# Patient Record
Sex: Male | Born: 1995 | Hispanic: Yes | Marital: Single | Smoking: Current every day smoker
Health system: Southern US, Community
[De-identification: ages and names within clinical notes are randomized; demographics above are authoritative.]

## PROBLEM LIST (undated history)

## (undated) VITALS — BP 140/90 | HR 89 | Temp 98.1°F | Resp 18

## (undated) DIAGNOSIS — F419 Anxiety disorder, unspecified: Secondary | ICD-10-CM

## (undated) DIAGNOSIS — J45909 Unspecified asthma, uncomplicated: Secondary | ICD-10-CM

## (undated) DIAGNOSIS — F909 Attention-deficit hyperactivity disorder, unspecified type: Secondary | ICD-10-CM

## (undated) DIAGNOSIS — F258 Other schizoaffective disorders: Secondary | ICD-10-CM

## (undated) DIAGNOSIS — F32A Depression, unspecified: Secondary | ICD-10-CM

## (undated) DIAGNOSIS — F329 Major depressive disorder, single episode, unspecified: Secondary | ICD-10-CM

## (undated) HISTORY — PX: APPENDECTOMY: SHX54

---
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
## 2016-05-27 IMAGING — DX DG CHEST 2V
2 series · 2 of 2 positions shown · non-contrast
Comparison: None.

CLINICAL DATA: Acute onset of cough and generalized weakness.
Initial encounter.

EXAM:
CHEST  2 VIEW

[w chest pa]
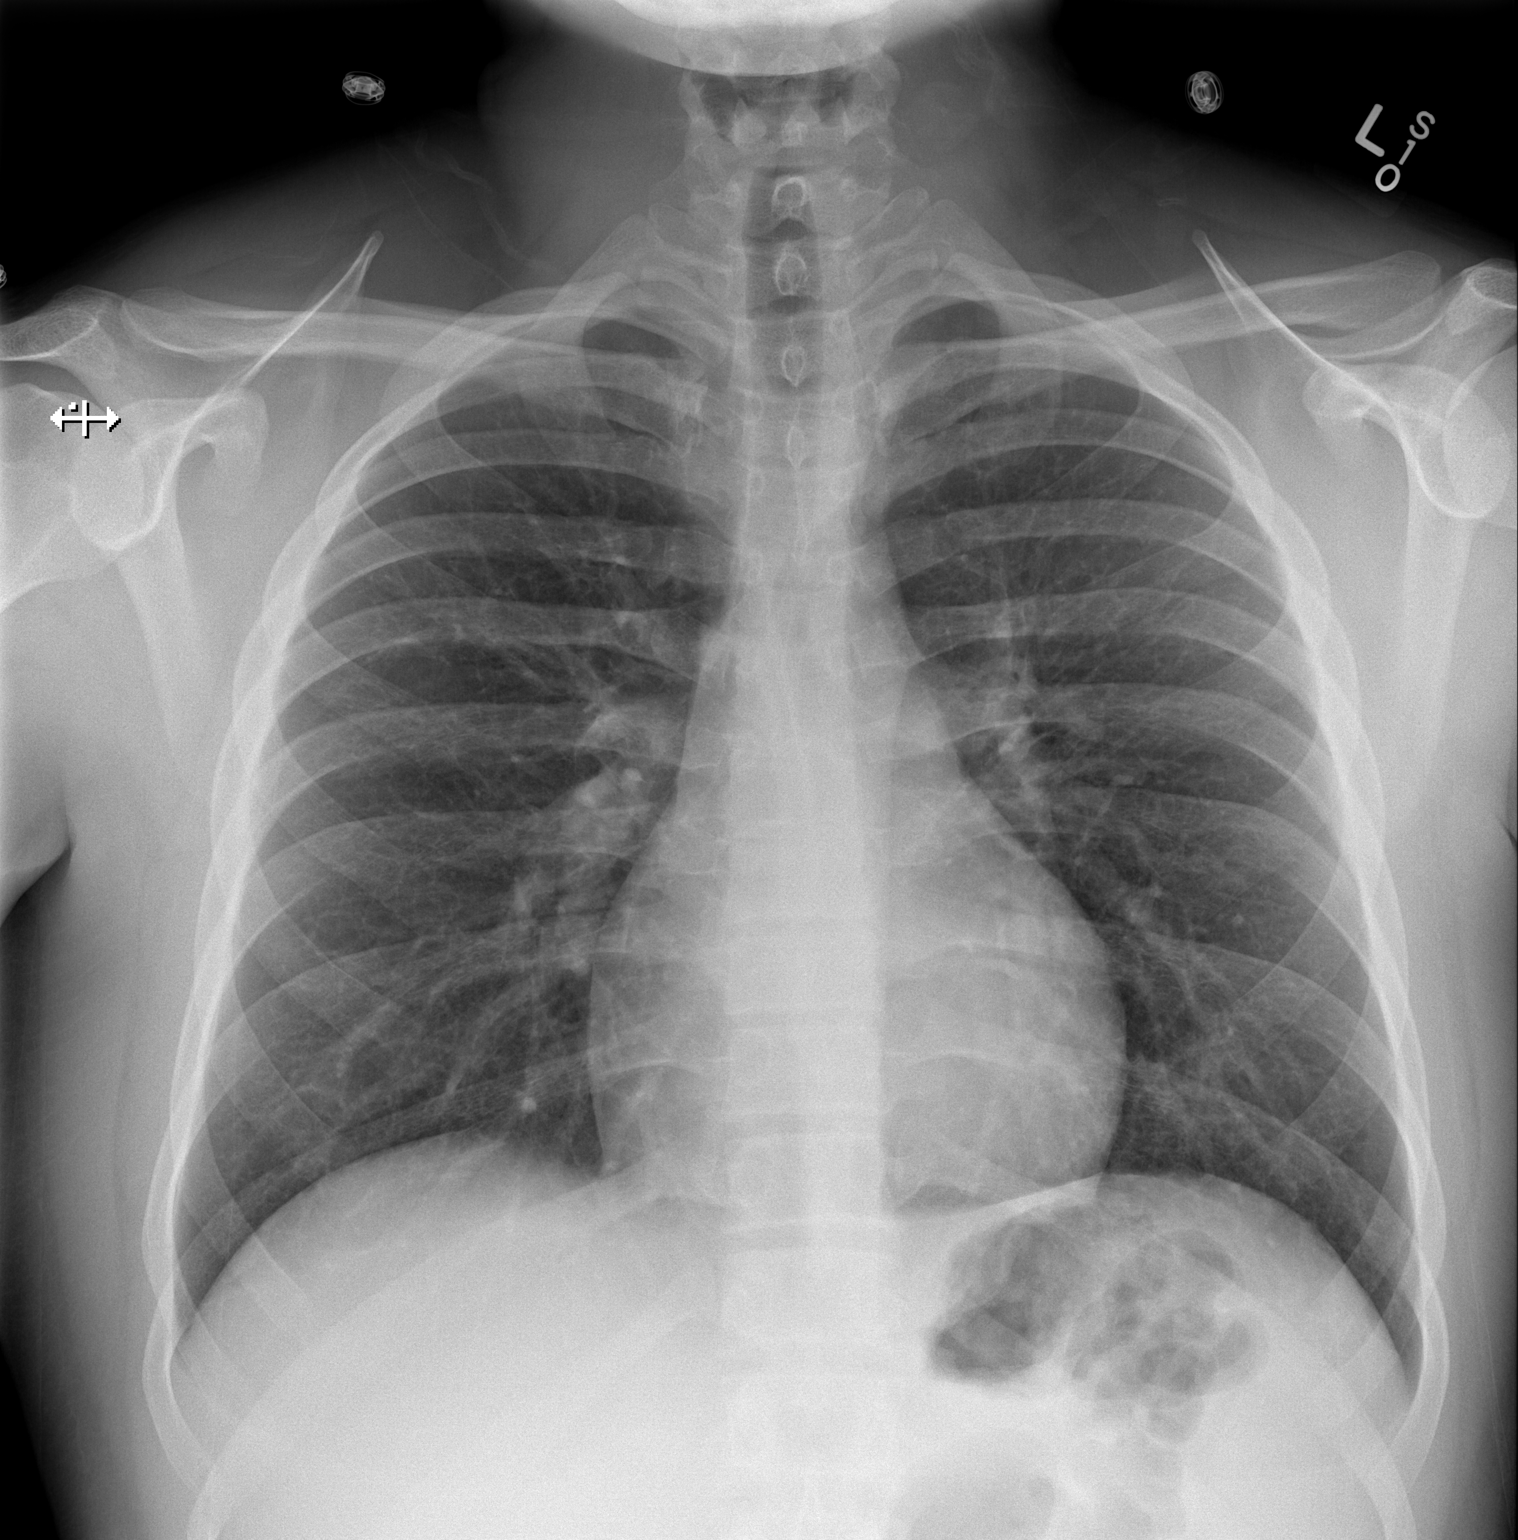

[w chest lat]
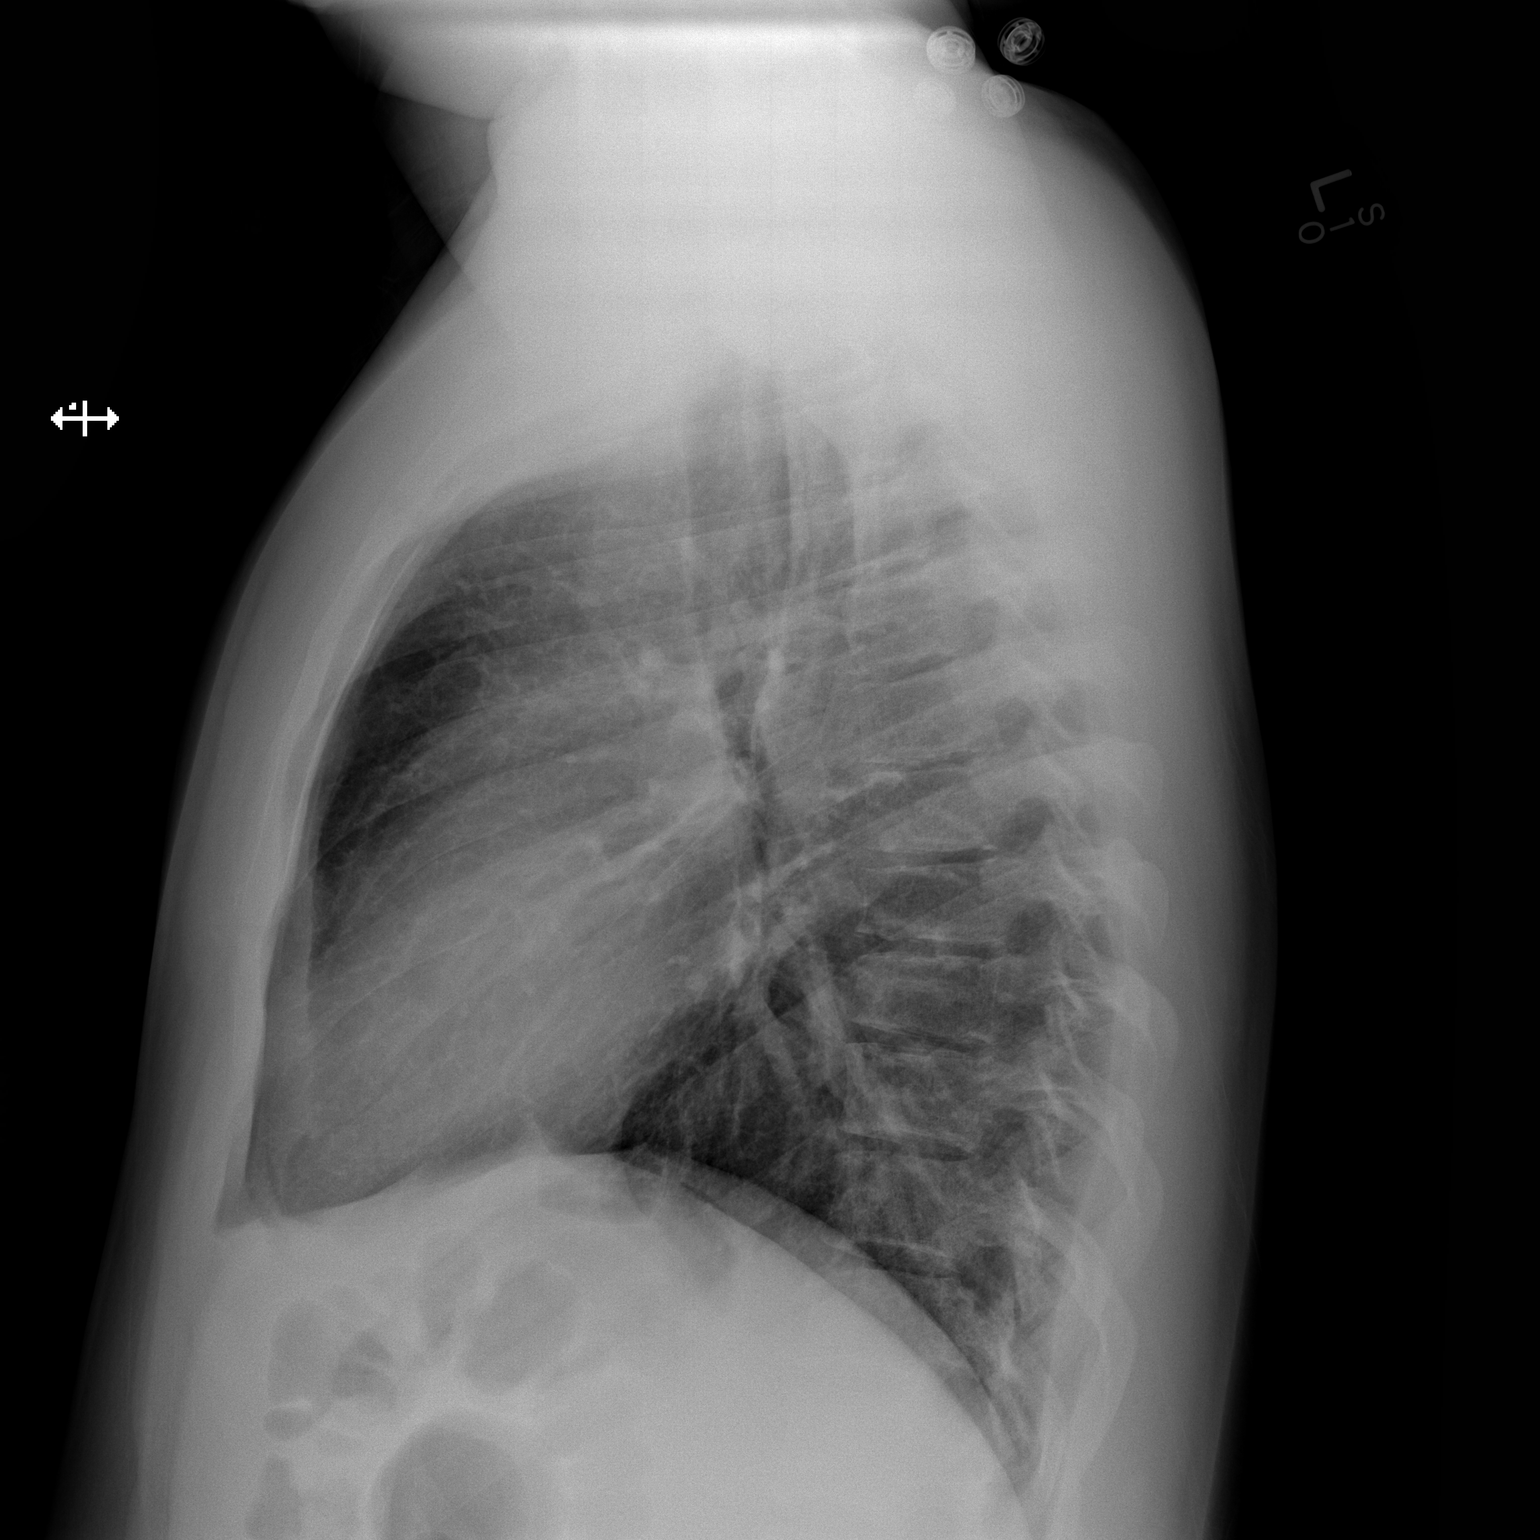

[2 of 2 positions shown; findings below may reference images not displayed]

FINDINGS: The lungs are well-aerated and clear. There is no evidence of focal
opacification, pleural effusion or pneumothorax.

The heart is normal in size; the mediastinal contour is within
normal limits. No acute osseous abnormalities are seen.
IMPRESSION: No acute cardiopulmonary process seen.

## 2016-07-24 ENCOUNTER — Encounter (HOSPITAL_COMMUNITY): Payer: Self-pay | Admitting: Emergency Medicine

## 2016-07-24 ENCOUNTER — Emergency Department (HOSPITAL_COMMUNITY)
Admission: EM | Admit: 2016-07-24 | Discharge: 2016-07-24 | Disposition: A | Discharge status: Home / Self Care | Payer: Medicaid Other | Attending: Emergency Medicine | Admitting: Emergency Medicine

## 2016-07-24 DIAGNOSIS — F17210 Nicotine dependence, cigarettes, uncomplicated: Secondary | ICD-10-CM | POA: Insufficient documentation

## 2016-07-24 DIAGNOSIS — Z5321 Procedure and treatment not carried out due to patient leaving prior to being seen by health care provider: Secondary | ICD-10-CM | POA: Insufficient documentation

## 2016-07-24 DIAGNOSIS — F909 Attention-deficit hyperactivity disorder, unspecified type: Secondary | ICD-10-CM | POA: Insufficient documentation

## 2016-07-24 DIAGNOSIS — J45909 Unspecified asthma, uncomplicated: Secondary | ICD-10-CM | POA: Diagnosis not present

## 2016-07-24 DIAGNOSIS — H5713 Ocular pain, bilateral: Secondary | ICD-10-CM | POA: Diagnosis not present

## 2016-07-24 HISTORY — DX: Unspecified asthma, uncomplicated: J45.909

## 2016-07-24 NOTE — ED Notes (Signed)
Call, no answer x2

## 2016-07-24 NOTE — ED Triage Notes (Signed)
Eye pain and tearing x 2 days, no in jury

## 2017-07-17 DIAGNOSIS — F259 Schizoaffective disorder, unspecified: Secondary | ICD-10-CM

## 2017-07-17 HISTORY — DX: Schizoaffective disorder, unspecified: F25.9

## 2018-10-26 ENCOUNTER — Encounter (HOSPITAL_COMMUNITY): Payer: Self-pay

## 2018-10-26 ENCOUNTER — Emergency Department (HOSPITAL_COMMUNITY): Payer: Medicaid Other

## 2018-10-26 ENCOUNTER — Other Ambulatory Visit: Payer: Self-pay

## 2018-10-26 ENCOUNTER — Inpatient Hospital Stay (HOSPITAL_COMMUNITY)
Admission: EM | Admit: 2018-10-26 | Discharge: 2018-10-29 | DRG: 199 | Disposition: A | Discharge status: Other Acute Inpatient Hospital | Payer: Medicaid Other | Attending: Internal Medicine | Admitting: Internal Medicine

## 2018-10-26 DIAGNOSIS — F419 Anxiety disorder, unspecified: Secondary | ICD-10-CM | POA: Diagnosis present

## 2018-10-26 DIAGNOSIS — J9383 Other pneumothorax: Secondary | ICD-10-CM | POA: Diagnosis present

## 2018-10-26 DIAGNOSIS — E876 Hypokalemia: Secondary | ICD-10-CM | POA: Diagnosis present

## 2018-10-26 DIAGNOSIS — F4310 Post-traumatic stress disorder, unspecified: Secondary | ICD-10-CM | POA: Diagnosis present

## 2018-10-26 DIAGNOSIS — R402142 Coma scale, eyes open, spontaneous, at arrival to emergency department: Secondary | ICD-10-CM | POA: Diagnosis present

## 2018-10-26 DIAGNOSIS — F331 Major depressive disorder, recurrent, moderate: Secondary | ICD-10-CM | POA: Diagnosis not present

## 2018-10-26 DIAGNOSIS — Z781 Physical restraint status: Secondary | ICD-10-CM

## 2018-10-26 DIAGNOSIS — F23 Brief psychotic disorder: Secondary | ICD-10-CM | POA: Diagnosis present

## 2018-10-26 DIAGNOSIS — Z915 Personal history of self-harm: Secondary | ICD-10-CM | POA: Diagnosis not present

## 2018-10-26 DIAGNOSIS — R040 Epistaxis: Secondary | ICD-10-CM | POA: Diagnosis present

## 2018-10-26 DIAGNOSIS — J982 Interstitial emphysema: Secondary | ICD-10-CM

## 2018-10-26 DIAGNOSIS — F17210 Nicotine dependence, cigarettes, uncomplicated: Secondary | ICD-10-CM | POA: Diagnosis present

## 2018-10-26 DIAGNOSIS — F909 Attention-deficit hyperactivity disorder, unspecified type: Secondary | ICD-10-CM | POA: Diagnosis present

## 2018-10-26 DIAGNOSIS — R402242 Coma scale, best verbal response, confused conversation, at arrival to emergency department: Secondary | ICD-10-CM | POA: Diagnosis present

## 2018-10-26 DIAGNOSIS — R402362 Coma scale, best motor response, obeys commands, at arrival to emergency department: Secondary | ICD-10-CM | POA: Diagnosis present

## 2018-10-26 DIAGNOSIS — Z008 Encounter for other general examination: Secondary | ICD-10-CM

## 2018-10-26 DIAGNOSIS — F201 Disorganized schizophrenia: Secondary | ICD-10-CM | POA: Diagnosis not present

## 2018-10-26 DIAGNOSIS — Z62810 Personal history of physical and sexual abuse in childhood: Secondary | ICD-10-CM | POA: Diagnosis present

## 2018-10-26 DIAGNOSIS — Z590 Homelessness: Secondary | ICD-10-CM

## 2018-10-26 DIAGNOSIS — G929 Unspecified toxic encephalopathy: Secondary | ICD-10-CM | POA: Diagnosis present

## 2018-10-26 DIAGNOSIS — G92 Toxic encephalopathy: Secondary | ICD-10-CM

## 2018-10-26 LAB — COMPREHENSIVE METABOLIC PANEL
ALT: 26 U/L (ref 0–44)
AST: 27 U/L (ref 15–41)
Albumin: 4.3 g/dL (ref 3.5–5.0)
Alkaline Phosphatase: 52 U/L (ref 38–126)
Anion gap: 14 (ref 5–15)
BUN: 12 mg/dL (ref 6–20)
CO2: 21 mmol/L — ABNORMAL LOW (ref 22–32)
Calcium: 9.1 mg/dL (ref 8.9–10.3)
Chloride: 104 mmol/L (ref 98–111)
Creatinine, Ser: 0.99 mg/dL (ref 0.61–1.24)
GFR calc Af Amer: >60 mL/min (ref >60)
GFR calc non Af Amer: >60 mL/min (ref >60)
Glucose, Bld: 101 mg/dL — ABNORMAL HIGH (ref 70–99)
Potassium: 3.1 mmol/L — ABNORMAL LOW (ref 3.5–5.1)
Sodium: 139 mmol/L (ref 135–145)
Total Bilirubin: 1.2 mg/dL (ref 0.3–1.2)
Total Protein: 7.2 g/dL (ref 6.5–8.1)

## 2018-10-26 LAB — CBC WITH DIFFERENTIAL/PLATELET
Abs Immature Granulocytes: 0.03 10*3/uL (ref 0.00–0.07)
Basophils Absolute: 0 10*3/uL (ref 0.0–0.1)
Basophils Relative: 0 %
Eosinophils Absolute: 0.2 10*3/uL (ref 0.0–0.5)
Eosinophils Relative: 3 %
HCT: 38.5 % — ABNORMAL LOW (ref 39.0–52.0)
Hemoglobin: 13.5 g/dL (ref 13.0–17.0)
Immature Granulocytes: 0 %
Lymphocytes Relative: 31 %
Lymphs Abs: 2.5 10*3/uL (ref 0.7–4.0)
MCH: 30.9 pg (ref 26.0–34.0)
MCHC: 35.1 g/dL (ref 30.0–36.0)
MCV: 88.1 fL (ref 80.0–100.0)
Monocytes Absolute: 0.6 10*3/uL (ref 0.1–1.0)
Monocytes Relative: 8 %
Neutro Abs: 4.8 10*3/uL (ref 1.7–7.7)
Neutrophils Relative %: 58 %
Platelets: 258 10*3/uL (ref 150–400)
RBC: 4.37 MIL/uL (ref 4.22–5.81)
RDW: 12.8 % (ref 11.5–15.5)
WBC: 8.1 10*3/uL (ref 4.0–10.5)
nRBC: 0 % (ref 0.0–0.2)

## 2018-10-26 LAB — ETHANOL: Alcohol, Ethyl (B): <10 mg/dL (ref <10)

## 2018-10-26 LAB — SALICYLATE LEVEL: Salicylate Lvl: <7 mg/dL (ref 2.8–30.0)

## 2018-10-26 LAB — ACETAMINOPHEN LEVEL: Acetaminophen (Tylenol), Serum: <10 ug/mL — ABNORMAL LOW (ref 10–30)

## 2018-10-26 MED ORDER — SODIUM CHLORIDE (PF) 0.9 % IJ SOLN
INTRAMUSCULAR | Status: AC
  Filled 2018-10-26: qty 50

## 2018-10-26 MED ORDER — IOHEXOL 300 MG/ML  SOLN
100.0000 mL | Freq: Once | INTRAMUSCULAR | Status: AC | PRN
  Administered 2018-10-26: 100 mL via INTRAVENOUS

## 2018-10-26 MED ORDER — LORAZEPAM 2 MG/ML IJ SOLN
2.0000 mg | Freq: Once | INTRAMUSCULAR | Status: DC
  Filled 2018-10-26: qty 1

## 2018-10-26 MED ORDER — POTASSIUM CHLORIDE 10 MEQ/100ML IV SOLN
10.0000 meq | INTRAVENOUS | Status: AC
  Administered 2018-10-26 – 2018-10-27 (×4): 10 meq via INTRAVENOUS
  Filled 2018-10-26 (×4): qty 100

## 2018-10-26 MED ORDER — LORAZEPAM 2 MG/ML IJ SOLN
2.0000 mg | Freq: Once | INTRAMUSCULAR | Status: AC
  Administered 2018-10-26: 2 mg via INTRAMUSCULAR

## 2018-10-26 MED ORDER — ZIPRASIDONE MESYLATE 20 MG IM SOLR
20.0000 mg | Freq: Once | INTRAMUSCULAR | Status: AC | PRN
  Administered 2018-10-26: 20 mg via INTRAMUSCULAR
  Filled 2018-10-26: qty 20

## 2018-10-26 MED ORDER — SODIUM CHLORIDE 0.9 % IV SOLN
INTRAVENOUS | Status: DC | PRN
  Administered 2018-10-26: 250 mL via INTRAVENOUS
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
## 2018-10-26 NOTE — ED Triage Notes (Addendum)
EMS reports, homeless, bystander called for bloody nose. Pt hx of bi-polar, schizophrenia. Pt non-compliant with meds. Pt uncooperative answering questions. Pt became erratic and attempted to elope, security was ordered by Dr. Freida Busman to return Pt to room.  BP 162/84 HR 72 RR 16

## 2018-10-26 NOTE — ED Notes (Signed)
ED TO INPATIENT HANDOFF REPORT  ED Nurse Name and Phone #: Delila Pereyra [phone]  S Name/Age/Gender Dalton Rosario 23 y.o. male Room/Bed: WA09/WA09  Code Status   Code Status: Full Code  Home/SNF/Other Home Patient oriented to: self, place, time and situation Is this baseline? Yes   Triage Complete: Triage complete  Chief Complaint med clearance  Triage Note EMS reports, homeless, bystander called for bloody nose. Pt hx of bi-polar, schizophrenia. Pt non-compliant with meds. Pt uncooperative answering questions. Pt became erratic and attempted to elope, security was ordered by Dr. Freida Busman to return Pt to room.  BP 162/84 HR 72 RR 16      Allergies No Known Allergies  Level of Care/Admitting Diagnosis ED Disposition    ED Disposition Condition Comment   Admit  Hospital Area: [facility] [[id number]]  Level of Care: Telemetry [5]  Admit to tele based on following criteria: Monitor QTC interval  Diagnosis: Pneumomediastinum [facility]) [[id number]]  Admitting Physician: Almon Hercules [[id number]]  Attending Physician: Almon Hercules [[id number]]  Estimated length of stay: past midnight tomorrow  Certification:: I certify this patient will need inpatient services for at least 2 midnights  PT Class (Do Not Modify): Inpatient [101]  PT Acc Code (Do Not Modify): Private [1]       B Medical/Surgery History Past Medical History:  Diagnosis Date  . ADHD (attention deficit hyperactivity disorder)   . Anxiety   . Asthma   . Depression    Past Surgical History:  Procedure Laterality Date  . APPENDECTOMY     Pt was 23 yo     A IV Location/Drains/Wounds Patient Lines/Drains/Airways Status   Active Line/Drains/Airways    Name:   Placement date:   Placement time:   Site:   Days:   Peripheral IV 10/26/18 Right Antecubital   10/26/18    1436    Antecubital   less than 1          Intake/Output Last 24 hours No intake or output data in the 24 hours ending  10/26/18 2111  Labs/Imaging Results for orders placed or performed during the hospital encounter of 10/26/18 (from the past 48 hour(s))  Ethanol     Status: None   Collection Time: 10/26/18  2:11 PM  Result Value Ref Range   Alcohol, Ethyl (B) <10 <10 mg/dL    Comment: (NOTE) Lowest detectable limit for serum alcohol is 10 mg/dL. For medical purposes only. Performed at [facility], [address], [place], [place] [address]   Salicylate level     Status: None   Collection Time: 10/26/18  2:11 PM  Result Value Ref Range   Salicylate Lvl <7.0 2.8 - 30.0 mg/dL    Comment: Performed at [facility], [address], [place], [place] [address]  Acetaminophen level     Status: Abnormal   Collection Time: 10/26/18  2:11 PM  Result Value Ref Range   Acetaminophen (Tylenol), Serum <10 (L) 10 - 30 ug/mL    Comment: (NOTE) Therapeutic concentrations vary significantly. A range of 10-30 ug/mL  may be an effective concentration for many patients. However, some  are best treated at concentrations outside of this range. Acetaminophen concentrations >150 ug/mL at 4 hours after ingestion  and >50 ug/mL at 12 hours after ingestion are often associated with  toxic reactions. Performed at [facility], [address] [place], [place] [address]   CBC with Differential/Platelet     Status:  Abnormal   Collection Time: 10/26/18  2:11 PM  Result Value Ref Range   WBC 8.1 4.0 - 10.5 K/uL   RBC 4.37 4.22 - 5.81 MIL/uL   Hemoglobin 13.5 13.0 - 17.0 g/dL   HCT 40.9 (L) 81.1 - 91.4 %   MCV 88.1 80.0 - 100.0 fL   MCH 30.9 26.0 - 34.0 pg   MCHC 35.1 30.0 - 36.0 g/dL   RDW 78.2 95.6 - 21.3 %   Platelets 258 150 - 400 K/uL   nRBC 0.0 0.0 - 0.2 %   Neutrophils Relative % 58 %   Neutro Abs 4.8 1.7 - 7.7 K/uL   Lymphocytes Relative 31 %   Lymphs Abs 2.5 0.7 - 4.0 K/uL   Monocytes Relative 8 %   Monocytes Absolute 0.6 0.1 - 1.0 K/uL    Eosinophils Relative 3 %   Eosinophils Absolute 0.2 0.0 - 0.5 K/uL   Basophils Relative 0 %   Basophils Absolute 0.0 0.0 - 0.1 K/uL   Immature Granulocytes 0 %   Abs Immature Granulocytes 0.03 0.00 - 0.07 K/uL    Comment: Performed at [facility], [address], [place], [place] [address]  Comprehensive metabolic panel     Status: Abnormal   Collection Time: 10/26/18  2:11 PM  Result Value Ref Range   Sodium 139 135 - 145 mmol/L   Potassium 3.1 (L) 3.5 - 5.1 mmol/L   Chloride 104 98 - 111 mmol/L   CO2 21 (L) 22 - 32 mmol/L   Glucose, Bld 101 (H) 70 - 99 mg/dL   BUN 12 6 - 20 mg/dL   Creatinine, Ser 8.46 0.61 - 1.24 mg/dL   Calcium 9.1 8.9 - 96.2 mg/dL   Total Protein 7.2 6.5 - 8.1 g/dL   Albumin 4.3 3.5 - 5.0 g/dL   AST 27 15 - 41 U/L   ALT 26 0 - 44 U/L   Alkaline Phosphatase 52 38 - 126 U/L   Total Bilirubin 1.2 0.3 - 1.2 mg/dL   GFR calc non Af Amer >60 >60 mL/min   GFR calc Af Amer >60 >60 mL/min   Anion gap 14 5 - 15    Comment: Performed at [facility], [address] [place], [place] [address]   Ct Head Wo Contrast  Result Date: 10/26/2018 CLINICAL DATA:  Altered level of consciousness. EXAM: CT HEAD WITHOUT CONTRAST CT CERVICAL SPINE WITHOUT CONTRAST TECHNIQUE: Multidetector CT imaging of the head and cervical spine was performed following the standard protocol without intravenous contrast. Multiplanar CT image reconstructions of the cervical spine were also generated. COMPARISON:  None. FINDINGS: CT HEAD FINDINGS Brain: No evidence of acute infarction, hemorrhage, hydrocephalus, extra-axial collection or mass lesion/mass effect. Vascular: No hyperdense vessel or unexpected calcification. Skull: Normal. Negative for fracture or focal lesion. Sinuses/Orbits: No acute finding. Other: None. CT CERVICAL SPINE FINDINGS Alignment: Normal. Skull base and vertebrae: No acute fracture. No primary bone lesion or focal pathologic process. Soft  tissues and spinal canal: No prevertebral fluid or swelling. No visible canal hematoma. Disc levels:  Normal. Upper chest: Negative. Other: Extensive amount of subcutaneous emphysema is noted in the cervical soft tissues which is concerning for extension from pneumomediastinum. IMPRESSION: Normal head CT. No evidence of cervical spine fracture or spondylolisthesis. Extensive amount of subcutaneous emphysema is noted in the cervical soft tissues which is concerning for extension from pneumomediastinum. Correlation with chest radiograph or CT scan is recommended. Electronically Signed   By: Fayrene Fearing  Christen ButterGreen Jr, M.D.   On: 10/26/2018 16:09   Ct Chest W Contrast  Result Date: 10/26/2018 CLINICAL DATA:  Blunt abdominal trauma. EXAM: CT CHEST, ABDOMEN, AND PELVIS WITH CONTRAST TECHNIQUE: Multidetector CT imaging of the chest, abdomen and pelvis was performed following the standard protocol during bolus administration of intravenous contrast. CONTRAST:  100mL OMNIPAQUE IOHEXOL 300 MG/ML  SOLN COMPARISON:  CT scan of May 18, 2015. FINDINGS: CT CHEST FINDINGS Cardiovascular: No significant vascular findings. Normal heart size. No pericardial effusion. Mediastinum/Nodes: Extensive pneumomediastinum is noted which extends into the supraclavicular regions bilaterally. Gas is also seen extending into the pre manubrial soft tissues. Thyroid gland is unremarkable. Lungs/Pleura: No pleural effusion is noted. Minimal pneumothorax is noted in the right major fissure which most likely represents extension of previously described pneumomediastinum. No definite consolidative process is noted in either lung. Musculoskeletal: No chest wall mass or suspicious bone lesions identified. CT ABDOMEN PELVIS FINDINGS Hepatobiliary: No focal liver abnormality is seen. No gallstones, gallbladder wall thickening, or biliary dilatation. Pancreas: Unremarkable. No pancreatic ductal dilatation or surrounding inflammatory changes. Spleen: Normal in  size without focal abnormality. Adrenals/Urinary Tract: Adrenal glands and kidneys appear normal. No hydronephrosis or renal obstruction is noted. Moderate diffuse urinary bladder wall thickening is noted which may represent lack of distension, but cystitis cannot be excluded. No renal or ureteral calculi are noted. Stomach/Bowel: The stomach appears normal. There is no evidence of bowel obstruction or inflammation. Status post appendectomy. Vascular/Lymphatic: No significant vascular findings are present. No enlarged abdominal or pelvic lymph nodes. Reproductive: Prostate is unremarkable. Other: No abdominal wall hernia or abnormality. No abdominopelvic ascites. Musculoskeletal: No acute or significant osseous findings. IMPRESSION: Extensive pneumomediastinum is noted which extends into the supraclavicular regions bilaterally. Gas is also seen extending into the premanubrial soft tissues. Minimal amount of pneumothorax is noted in the right major fissure which most likely represents extension of pneumomediastinum. These findings are concerning for possible tracheal, bronchial or esophageal injury or perforation. Clinical correlation is recommended. Moderate diffuse urinary bladder wall thickening is noted which may represent lack of distension, but cystitis cannot be excluded. Clinical correlation is recommended. No other abnormality seen in the abdomen or pelvis. Electronically Signed   By: Lupita RaiderJames  Green Jr, M.D.   On: 10/26/2018 18:52   Ct Cervical Spine Wo Contrast  Result Date: 10/26/2018 CLINICAL DATA:  Altered level of consciousness. EXAM: CT HEAD WITHOUT CONTRAST CT CERVICAL SPINE WITHOUT CONTRAST TECHNIQUE: Multidetector CT imaging of the head and cervical spine was performed following the standard protocol without intravenous contrast. Multiplanar CT image reconstructions of the cervical spine were also generated. COMPARISON:  None. FINDINGS: CT HEAD FINDINGS Brain: No evidence of acute infarction,  hemorrhage, hydrocephalus, extra-axial collection or mass lesion/mass effect. Vascular: No hyperdense vessel or unexpected calcification. Skull: Normal. Negative for fracture or focal lesion. Sinuses/Orbits: No acute finding. Other: None. CT CERVICAL SPINE FINDINGS Alignment: Normal. Skull base and vertebrae: No acute fracture. No primary bone lesion or focal pathologic process. Soft tissues and spinal canal: No prevertebral fluid or swelling. No visible canal hematoma. Disc levels:  Normal. Upper chest: Negative. Other: Extensive amount of subcutaneous emphysema is noted in the cervical soft tissues which is concerning for extension from pneumomediastinum. IMPRESSION: Normal head CT. No evidence of cervical spine fracture or spondylolisthesis. Extensive amount of subcutaneous emphysema is noted in the cervical soft tissues which is concerning for extension from pneumomediastinum. Correlation with chest radiograph or CT scan is recommended. Electronically Signed   By: Fayrene FearingJames  Christen Butter, M.D.   On: 10/26/2018 16:09   Ct Abdomen Pelvis W Contrast  Result Date: 10/26/2018 CLINICAL DATA:  Blunt abdominal trauma. EXAM: CT CHEST, ABDOMEN, AND PELVIS WITH CONTRAST TECHNIQUE: Multidetector CT imaging of the chest, abdomen and pelvis was performed following the standard protocol during bolus administration of intravenous contrast. CONTRAST:  OMNIPAQUE IOHEXOL 300 MG/ML  SOLN COMPARISON:  CT scan of May 18, 2015. FINDINGS: CT CHEST FINDINGS Cardiovascular: No significant vascular findings. Normal heart size. No pericardial effusion. Mediastinum/Nodes: Extensive pneumomediastinum is noted which extends into the supraclavicular regions bilaterally. Gas is also seen extending into the pre manubrial soft tissues. Thyroid gland is unremarkable. Lungs/Pleura: No pleural effusion is noted. Minimal pneumothorax is noted in the right major fissure which most likely represents extension of previously described  pneumomediastinum. No definite consolidative process is noted in either lung. Musculoskeletal: No chest wall mass or suspicious bone lesions identified. CT ABDOMEN PELVIS FINDINGS Hepatobiliary: No focal liver abnormality is seen. No gallstones, gallbladder wall thickening, or biliary dilatation. Pancreas: Unremarkable. No pancreatic ductal dilatation or surrounding inflammatory changes. Spleen: Normal in size without focal abnormality. Adrenals/Urinary Tract: Adrenal glands and kidneys appear normal. No hydronephrosis or renal obstruction is noted. Moderate diffuse urinary bladder wall thickening is noted which may represent lack of distension, but cystitis cannot be excluded. No renal or ureteral calculi are noted. Stomach/Bowel: The stomach appears normal. There is no evidence of bowel obstruction or inflammation. Status post appendectomy. Vascular/Lymphatic: No significant vascular findings are present. No enlarged abdominal or pelvic lymph nodes. Reproductive: Prostate is unremarkable. Other: No abdominal wall hernia or abnormality. No abdominopelvic ascites. Musculoskeletal: No acute or significant osseous findings. IMPRESSION: Extensive pneumomediastinum is noted which extends into the supraclavicular regions bilaterally. Gas is also seen extending into the premanubrial soft tissues. Minimal amount of pneumothorax is noted in the right major fissure which most likely represents extension of pneumomediastinum. These findings are concerning for possible tracheal, bronchial or esophageal injury or perforation. Clinical correlation is recommended. Moderate diffuse urinary bladder wall thickening is noted which may represent lack of distension, but cystitis cannot be excluded. Clinical correlation is recommended. No other abnormality seen in the abdomen or pelvis. Electronically Signed   By: Lupita Raider, M.D.   On: 10/26/2018 18:52   Dg Chest Port 1 View  Result Date: 10/26/2018 CLINICAL DATA:  Shortness  of breath. EXAM: PORTABLE CHEST 1 VIEW COMPARISON:  Radiographs of August 06, 2015. FINDINGS: Extensive pneumomediastinum is noted with subcutaneous emphysema is seen in both supraclavicular regions. No definite pneumothorax is noted. No pleural effusion is noted. No pulmonary opacity is noted. Bony thorax is unremarkable. IMPRESSION: Pneumomediastinum is noted with subcutaneous emphysema seen in both supraclavicular regions. This is concerning for possible esophageal or bronchial rupture or injury. Electronically Signed   By: Lupita Raider, M.D.   On: 10/26/2018 17:39    Pending Labs Unresulted Labs (From admission, onward)    Start     Ordered   10/27/18 0500  CBC  Tomorrow morning,   R     10/26/18 2004   10/27/18 0500  Basic metabolic panel  Tomorrow morning,   R     10/26/18 2004   10/27/18 0500  Protime-INR  Tomorrow morning,   R     10/26/18 2004   10/27/18 0500  APTT  Tomorrow morning,   R     10/26/18 2004   10/26/18 2001  HIV antibody (Routine Testing)  Once,   R  10/26/18 2004   10/26/18 1411  Rapid urine drug screen (hospital performed)  ONCE - STAT,   R     10/26/18 1410          Vitals/Pain Today's Vitals   10/26/18 1700 10/26/18 1851 10/26/18 1900 10/26/18 2000  BP: 113/62 127/66 (!) 135/57 (!) 109/55  Pulse: 77 70 69 (!) 54  Resp: 16 16 15 13   SpO2: 98% 96% 97% 100%    Isolation Precautions No active isolations  Medications Medications  sodium chloride (PF) 0.9 % injection (has no administration in time range)  potassium chloride 10 mEq in 100 mL IVPB (has no administration in time range)  ziprasidone (GEODON) injection 20 mg (20 mg Intramuscular Given 10/26/18 1418)  LORazepam (ATIVAN) injection 2 mg (2 mg Intramuscular Given 10/26/18 1438)  iohexol (OMNIPAQUE) 300 MG/ML solution 100 mL (100 mLs Intravenous Contrast Given 10/26/18 1756)    Mobility walks     Focused Assessments Cardiac Assessment Handoff:    No results found for: CKTOTAL, CKMB,  CKMBINDEX, TROPONINI No results found for: DDIMER Does the Patient currently have chest pain? No      R Recommendations: See Admitting Provider Note  Report given to: Brynda Greathouse RN  Additional Notes:

## 2018-10-26 NOTE — ED Notes (Signed)
Patient going upstairs. Sitter is with patient.

## 2018-10-26 NOTE — ED Provider Notes (Signed)
Blood pressure 113/62, pulse 77, resp. rate 16, SpO2 98 %.  Assuming care from Dr. Freida Busman.  In short, Dalton Rosario is a 23 y.o. male with a chief complaint of Medical Clearance .  Refer to the original H&P for additional details.  The current plan of care is to follow up on CT imaging and CXR.  07:22 PM  CT imaging and plain films reviewed.  Patient with diffuse pneumomediastinum with some extension into the right major fissure leading to some pneumothorax but thought to be an extension of the pneumomediastinum rather than the primary event.  Patient is somnolent after sedation.  He awakens to voice but then goes back to sleep.  He is in soft restraints.  Plan to discuss with cardiothoracic surgery regarding pneumomediastinum management and additional imaging and/or monitoring. No outward sign of neck trauma or strangulation. Patient unable to provide additional history at this time.   Discussed the case with Dr. Tyrone Sage. Plan to keep the patient NPO and get repeat CXR in the AM. Would like swallow study when patient able to cooperate after sedation but could get in the AM. Dr. Tyrone Sage will follow. Ok with staying at [employer].   According to initial EDP, the patient has a psychiatry history and appeared to be responding to internal stimuli. Will likely need TTS evaluation but patient not medically clear at this time.   Discussed patient's case with Hospitalist to request admission. Patient and family (if present) updated with plan. Care transferred to Hospitalist service.  I reviewed all nursing notes, vitals, pertinent old records, EKGs, labs, imaging (as available).    Maia Plan, MD 10/26/18 2100

## 2018-10-26 NOTE — ED Notes (Signed)
Waiting on a sitter to arrive for patient

## 2018-10-26 NOTE — H&P (Signed)
History and Physical    Dalton Rosario ZOX:[id number] DOB: [birth year] DOA: 10/26/2018  PCP: [facility] & Patient coming from: Unknown  Patient was somnolent after chemical restraint in ED and was not able to provide history.  Chief Complaint: "Medical clearance"  HPI: Dalton Rosario is a 23 y.o. male with history of depression, PTSD and ADHD brought to ED by EMS after found by a bystander sitting outside with bloody nose.    In ED, vital signs not impressive.  CBC within normal range.  CMP with hypokalemia to 3.1 and bicarb to 21 otherwise normal.  EKG normal sinus rhythm.  Tylenol, salicylate and alcohol levels within normal range.  CT head and cervical spine without acute intracranial finding or osseous issues but extensive amount of subcutaneous emphysema in the cervical soft tissue concerning for pneumomediastinum.  This was also noted on portable chest x-ray and CT chest.  Concern about possible tracheal, bronchial or esophageal injury or perforation.  Per EDP, Dr. Tyrone Sage from [facility] was consulted and recommended keeping patient n.p.o., repeat chest x-ray in the morning and a swallow study when patient is able to cooperate. Per EDP, Dr. Tyrone Sage will see patient in the morning.  Per EDP note, patient was involuntarily committed and restrained chemically after he threatened to leave ED.    ROS Not able to obtain due to patient's mental status. PMH Past Medical History:  Diagnosis Date   ADHD (attention deficit hyperactivity disorder)    Anxiety    Asthma    Depression    PSH Past Surgical History:  Procedure Laterality Date   APPENDECTOMY     Pt was 23 yo   Fam HX Not able to obtain due to patient's mental status.  Social Hx Not able to obtain due to patient's mental status. Allergy Not able to obtain due to patient mental status. Prior to Admission medications   Medication Sig Start Date End Date Taking? Authorizing  Provider  acetaminophen (TYLENOL 8 HOUR) 650 MG CR tablet Take 1 tablet (650 mg total) by mouth every 8 (eight) hours as needed for pain. Patient not taking: Reported on 12/12/2015 05/18/15   Emilia Beck, PA-C  acetaminophen (TYLENOL) 325 MG tablet Take 325 mg by mouth every 6 (six) hours as needed for moderate pain.    [provider]  ARIPiprazole (ABILIFY) 15 MG tablet Take 1 tablet (15 mg total) by mouth every morning. Patient not taking: Reported on 12/05/2014 06/26/12   Trinda Pascal B, NP  belladonna-opium (B&O SUPPRETTES) 16.2-30 MG suppository Place 1 suppository rectally every 8 (eight) hours as needed for pain. Patient not taking: Reported on 05/18/2015 05/16/15   Jonette Eva, MD  benzonatate (TESSALON PERLES) 100 MG capsule Take 1 capsule (100 mg total) by mouth 3 (three) times daily as needed for cough. Patient not taking: Reported on 12/12/2015 05/16/15   Jonette Eva, MD  benzonatate (TESSALON) 100 MG capsule Take 1 capsule (100 mg total) by mouth every 8 (eight) hours. Patient not taking: Reported on 12/12/2015 08/06/15   Dowless, Lelon Mast Tripp, PA-C  clindamycin (CLEOCIN) 150 MG capsule Take 2 capsules (300 mg total) by mouth 3 (three) times daily. May dispense as  capsules Patient not taking: Reported on 07/06/2015 05/18/15   Emilia Beck, PA-C  docusate sodium (COLACE) 100 MG capsule Take 1 capsule (100 mg total) by mouth daily as needed for mild constipation. Patient not taking: Reported on 05/18/2015 05/16/15   Jonette Eva, MD  escitalopram (LEXAPRO) 10 MG tablet  Take 1 tablet (10 mg total) by mouth every morning. Patient not taking: Reported on 12/05/2014 06/26/12   Trinda Pascal B, NP  ibuprofen (ADVIL,MOTRIN) 200 MG tablet Take 400 mg by mouth every 6 (six) hours as needed for headache, mild pain or moderate pain.    [provider]  methylphenidate 36 MG PO CR tablet Take 1 tablet (36 mg total) by mouth daily. Patient not taking: Reported on  11/17/2014 11/13/14   Radene Gunning, MD  Misc. Devices (SITZ BATH) MISC Twice daily for the next week. Patient not taking: Reported on 05/18/2015 05/16/15   Jonette Eva, MD  nitrofurantoin, macrocrystal-monohydrate, (MACROBID) 100 MG capsule Take 1 capsule (100 mg total) by mouth every 12 (twelve) hours. Patient needs 13 more doses to complete a 10day course of antibiotics for UTI. Patient not taking: Reported on 11/17/2014 06/26/12   Trinda Pascal B, NP  ondansetron (ZOFRAN-ODT) 8 MG disintegrating tablet Take 1 tablet (8 mg total) by mouth every 8 (eight) hours as needed for nausea or vomiting. Patient not taking: Reported on 05/18/2015 11/13/14   Radene Gunning, MD  ranitidine (ZANTAC) 150 MG tablet Take 1 tablet (150 mg total) by mouth 2 (two) times daily. 07/06/15   Melene Plan, DO  simethicone (MYLICON) 125 MG chewable tablet Chew 125 mg by mouth every 6 (six) hours as needed for flatulence.    [provider]    Physical Exam: Vitals:   10/26/18 1547 10/26/18 1600 10/26/18 1700 10/26/18 1851  BP: 136/67 127/60 113/62 127/66  Pulse: 88 82 77 70  Resp: 14  16 16   SpO2: 97% 96% 98% 96%    Constitutional -somnolent.  Resting comfortably.  No acute distress. Eyes - vision grossly intact. Sclera anicteric.  Pupils about 3 to 4 mm wide and reactive to light. Nose - no gross deformity but some dried blood on his upper lips Mouth - no oral lesions noted Throat - no swelling or erythema Endo - no obvious thyromegaly CV -RRR. (+)S1S2, no murmurs; no JVD or peripheral edema Resp - No increased work of breathing, good aeration bilaterally, no wheeze or crackles GI - (+)BS, soft, non-tender, non-distended MSK - normal range of motion, no obvious deformity Skin - no obvious rashes or lesions.  Crepitus along the clavicular lines on both side Neuro -somnolent.  Moans in response to noxious stimuli.  Moves extremities spontaneously. Psych -somnolent.  Labs on Admission: I have personally  reviewed following labs and imaging studies  CBC: Recent Labs  Lab 10/26/18 1411  WBC 8.1  NEUTROABS 4.8  HGB 13.5  HCT 38.5*  MCV 88.1  PLT 258   Basic Metabolic Panel: Recent Labs  Lab 10/26/18 1411  NA 139  K 3.1*  CL 104  CO2 21*  GLUCOSE 101*  BUN 12  CREATININE 0.99  CALCIUM 9.1   GFR: CrCl cannot be calculated (Unknown ideal weight.). Liver Function Tests: Recent Labs  Lab 10/26/18 1411  AST 27  ALT 26  ALKPHOS 52  BILITOT 1.2  PROT 7.2  ALBUMIN 4.3   No results for input(s): LIPASE, AMYLASE in the last 168 hours. No results for input(s): AMMONIA in the last 168 hours. Coagulation Profile: No results for input(s): INR, PROTIME in the last 168 hours. Cardiac Enzymes: No results for input(s): CKTOTAL, CKMB, CKMBINDEX, TROPONINI in the last 168 hours. BNP (last 3 results) No results for input(s): PROBNP in the last 8760 hours. HbA1C: No results for input(s): HGBA1C in the last 72 hours.  CBG: No results for input(s): GLUCAP in the last 168 hours. Lipid Profile: No results for input(s): CHOL, HDL, LDLCALC, TRIG, CHOLHDL, LDLDIRECT in the last 72 hours. Thyroid Function Tests: No results for input(s): TSH, T4TOTAL, FREET4, T3FREE, THYROIDAB in the last 72 hours. Anemia Panel: No results for input(s): VITAMINB12, FOLATE, FERRITIN, TIBC, IRON, RETICCTPCT in the last 72 hours. Urine analysis:    Component Value Date/Time   COLORURINE YELLOW 12/12/2015 1013   APPEARANCEUR CLOUDY (A) 12/12/2015 1013   LABSPEC 1.025 12/12/2015 1013   PHURINE 6.0 12/12/2015 1013   GLUCOSEU NEGATIVE 12/12/2015 1013   HGBUR NEGATIVE 12/12/2015 1013   BILIRUBINUR NEGATIVE 12/12/2015 1013   KETONESUR NEGATIVE 12/12/2015 1013   PROTEINUR NEGATIVE 12/12/2015 1013   UROBILINOGEN 0.2 06/24/2012 2013   NITRITE NEGATIVE 12/12/2015 1013   LEUKOCYTESUR SMALL (A) 12/12/2015 1013    Sepsis Labs:  WBC within normal range.  Radiological Exams on Admission: Ct Head Wo  Contrast  Result Date: 10/26/2018 CLINICAL DATA:  Altered level of consciousness. EXAM: CT HEAD WITHOUT CONTRAST CT CERVICAL SPINE WITHOUT CONTRAST TECHNIQUE: Multidetector CT imaging of the head and cervical spine was performed following the standard protocol without intravenous contrast. Multiplanar CT image reconstructions of the cervical spine were also generated. COMPARISON:  None. FINDINGS: CT HEAD FINDINGS Brain: No evidence of acute infarction, hemorrhage, hydrocephalus, extra-axial collection or mass lesion/mass effect. Vascular: No hyperdense vessel or unexpected calcification. Skull: Normal. Negative for fracture or focal lesion. Sinuses/Orbits: No acute finding. Other: None. CT CERVICAL SPINE FINDINGS Alignment: Normal. Skull base and vertebrae: No acute fracture. No primary bone lesion or focal pathologic process. Soft tissues and spinal canal: No prevertebral fluid or swelling. No visible canal hematoma. Disc levels:  Normal. Upper chest: Negative. Other: Extensive amount of subcutaneous emphysema is noted in the cervical soft tissues which is concerning for extension from pneumomediastinum. IMPRESSION: Normal head CT. No evidence of cervical spine fracture or spondylolisthesis. Extensive amount of subcutaneous emphysema is noted in the cervical soft tissues which is concerning for extension from pneumomediastinum. Correlation with chest radiograph or CT scan is recommended. Electronically Signed   By: Lupita Raider, M.D.   On: 10/26/2018 16:09   Ct Chest W Contrast  Result Date: 10/26/2018 CLINICAL DATA:  Blunt abdominal trauma. EXAM: CT CHEST, ABDOMEN, AND PELVIS WITH CONTRAST TECHNIQUE: Multidetector CT imaging of the chest, abdomen and pelvis was performed following the standard protocol during bolus administration of intravenous contrast. CONTRAST:  OMNIPAQUE IOHEXOL 300 MG/ML  SOLN COMPARISON:  CT scan of May 18, 2015. FINDINGS: CT CHEST FINDINGS Cardiovascular: No significant  vascular findings. Normal heart size. No pericardial effusion. Mediastinum/Nodes: Extensive pneumomediastinum is noted which extends into the supraclavicular regions bilaterally. Gas is also seen extending into the pre manubrial soft tissues. Thyroid gland is unremarkable. Lungs/Pleura: No pleural effusion is noted. Minimal pneumothorax is noted in the right major fissure which most likely represents extension of previously described pneumomediastinum. No definite consolidative process is noted in either lung. Musculoskeletal: No chest wall mass or suspicious bone lesions identified. CT ABDOMEN PELVIS FINDINGS Hepatobiliary: No focal liver abnormality is seen. No gallstones, gallbladder wall thickening, or biliary dilatation. Pancreas: Unremarkable. No pancreatic ductal dilatation or surrounding inflammatory changes. Spleen: Normal in size without focal abnormality. Adrenals/Urinary Tract: Adrenal glands and kidneys appear normal. No hydronephrosis or renal obstruction is noted. Moderate diffuse urinary bladder wall thickening is noted which may represent lack of distension, but cystitis cannot be excluded. No renal or ureteral calculi are noted.  Stomach/Bowel: The stomach appears normal. There is no evidence of bowel obstruction or inflammation. Status post appendectomy. Vascular/Lymphatic: No significant vascular findings are present. No enlarged abdominal or pelvic lymph nodes. Reproductive: Prostate is unremarkable. Other: No abdominal wall hernia or abnormality. No abdominopelvic ascites. Musculoskeletal: No acute or significant osseous findings. IMPRESSION: Extensive pneumomediastinum is noted which extends into the supraclavicular regions bilaterally. Gas is also seen extending into the premanubrial soft tissues. Minimal amount of pneumothorax is noted in the right major fissure which most likely represents extension of pneumomediastinum. These findings are concerning for possible tracheal, bronchial or  esophageal injury or perforation. Clinical correlation is recommended. Moderate diffuse urinary bladder wall thickening is noted which may represent lack of distension, but cystitis cannot be excluded. Clinical correlation is recommended. No other abnormality seen in the abdomen or pelvis. Electronically Signed   By: Lupita Raider, M.D.   On: 10/26/2018 18:52   Ct Cervical Spine Wo Contrast  Result Date: 10/26/2018 CLINICAL DATA:  Altered level of consciousness. EXAM: CT HEAD WITHOUT CONTRAST CT CERVICAL SPINE WITHOUT CONTRAST TECHNIQUE: Multidetector CT imaging of the head and cervical spine was performed following the standard protocol without intravenous contrast. Multiplanar CT image reconstructions of the cervical spine were also generated. COMPARISON:  None. FINDINGS: CT HEAD FINDINGS Brain: No evidence of acute infarction, hemorrhage, hydrocephalus, extra-axial collection or mass lesion/mass effect. Vascular: No hyperdense vessel or unexpected calcification. Skull: Normal. Negative for fracture or focal lesion. Sinuses/Orbits: No acute finding. Other: None. CT CERVICAL SPINE FINDINGS Alignment: Normal. Skull base and vertebrae: No acute fracture. No primary bone lesion or focal pathologic process. Soft tissues and spinal canal: No prevertebral fluid or swelling. No visible canal hematoma. Disc levels:  Normal. Upper chest: Negative. Other: Extensive amount of subcutaneous emphysema is noted in the cervical soft tissues which is concerning for extension from pneumomediastinum. IMPRESSION: Normal head CT. No evidence of cervical spine fracture or spondylolisthesis. Extensive amount of subcutaneous emphysema is noted in the cervical soft tissues which is concerning for extension from pneumomediastinum. Correlation with chest radiograph or CT scan is recommended. Electronically Signed   By: Lupita Raider, M.D.   On: 10/26/2018 16:09   Ct Abdomen Pelvis W Contrast  Result Date: 10/26/2018 CLINICAL  DATA:  Blunt abdominal trauma. EXAM: CT CHEST, ABDOMEN, AND PELVIS WITH CONTRAST TECHNIQUE: Multidetector CT imaging of the chest, abdomen and pelvis was performed following the standard protocol during bolus administration of intravenous contrast. CONTRAST:  OMNIPAQUE IOHEXOL 300 MG/ML  SOLN COMPARISON:  CT scan of May 18, 2015. FINDINGS: CT CHEST FINDINGS Cardiovascular: No significant vascular findings. Normal heart size. No pericardial effusion. Mediastinum/Nodes: Extensive pneumomediastinum is noted which extends into the supraclavicular regions bilaterally. Gas is also seen extending into the pre manubrial soft tissues. Thyroid gland is unremarkable. Lungs/Pleura: No pleural effusion is noted. Minimal pneumothorax is noted in the right major fissure which most likely represents extension of previously described pneumomediastinum. No definite consolidative process is noted in either lung. Musculoskeletal: No chest wall mass or suspicious bone lesions identified. CT ABDOMEN PELVIS FINDINGS Hepatobiliary: No focal liver abnormality is seen. No gallstones, gallbladder wall thickening, or biliary dilatation. Pancreas: Unremarkable. No pancreatic ductal dilatation or surrounding inflammatory changes. Spleen: Normal in size without focal abnormality. Adrenals/Urinary Tract: Adrenal glands and kidneys appear normal. No hydronephrosis or renal obstruction is noted. Moderate diffuse urinary bladder wall thickening is noted which may represent lack of distension, but cystitis cannot be excluded. No renal or ureteral calculi are  noted. Stomach/Bowel: The stomach appears normal. There is no evidence of bowel obstruction or inflammation. Status post appendectomy. Vascular/Lymphatic: No significant vascular findings are present. No enlarged abdominal or pelvic lymph nodes. Reproductive: Prostate is unremarkable. Other: No abdominal wall hernia or abnormality. No abdominopelvic ascites. Musculoskeletal: No acute or  significant osseous findings. IMPRESSION: Extensive pneumomediastinum is noted which extends into the supraclavicular regions bilaterally. Gas is also seen extending into the premanubrial soft tissues. Minimal amount of pneumothorax is noted in the right major fissure which most likely represents extension of pneumomediastinum. These findings are concerning for possible tracheal, bronchial or esophageal injury or perforation. Clinical correlation is recommended. Moderate diffuse urinary bladder wall thickening is noted which may represent lack of distension, but cystitis cannot be excluded. Clinical correlation is recommended. No other abnormality seen in the abdomen or pelvis. Electronically Signed   By: Lupita RaiderJames  Green Jr, M.D.   On: 10/26/2018 18:52   Dg Chest Port 1 View  Result Date: 10/26/2018 CLINICAL DATA:  Shortness of breath. EXAM: PORTABLE CHEST 1 VIEW COMPARISON:  Radiographs of August 06, 2015. FINDINGS: Extensive pneumomediastinum is noted with subcutaneous emphysema is seen in both supraclavicular regions. No definite pneumothorax is noted. No pleural effusion is noted. No pulmonary opacity is noted. Bony thorax is unremarkable. IMPRESSION: Pneumomediastinum is noted with subcutaneous emphysema seen in both supraclavicular regions. This is concerning for possible esophageal or bronchial rupture or injury. Electronically Signed   By: Lupita RaiderJames  Green Jr, M.D.   On: 10/26/2018 17:39    All images have been reviewed by me personally.   EKG: Independently reviewed and showed normal sinus rhythm without acute ischemic finding.   Assessment/Plan Principal Problem:   Pneumomediastinum (HCC) Active Problems:   MDD (major depressive disorder), recurrent episode, moderate (HCC)   Toxic encephalopathy   Hypokalemia  Pneumomediastinum: unclear etiology of this at this point. Concerning for possible tracheal, bronchial or esophageal injury or perforation.  Hemodynamically stable. -CTS to see patient  in the morning. -Repeat chest x-ray in the morning. -Swallow eval if patient cooperates.  Currently somnolent. -N.p.o.  Acute encephalopathy/agitation: Likely toxic.  No finding to suggest infectious process.  Limited neuro exam but no focal neuro deficit. -Involuntary committed and chemically restrained in ED. -May need Haldol as needed. -Follow UDS  Hypokalemia -Replenish and recheck.  Psych issues/MDD/PTSD/ADHD -Consult psych when medically stable.  DVT prophylaxis: SCD Code Status: Full code Family Communication: None at bedside  Disposition Plan: Admit to telemetry bed Consults called: Cardiothoracic surgery Admission status: Inpatient status   Almon Herculesaye T Edwina Grossberg MD [facility]  If 7PM-7AM, please contact night-coverage [email or web address] Password [email or web address]  10/26/2018, 8:19 PM

## 2018-10-26 NOTE — Progress Notes (Signed)
[address]       [place] [id number]             [phone]      Asked by Dr Kathrene BongoLong-ED to review case and ct's AS noted in history patient is not able to give any history. Not noted to have any evidnece of external neck trauma penetrating or blunt force Ct Head Wo Contrast  Result Date: 10/26/2018 CLINICAL DATA:  Altered level of consciousness. EXAM: CT HEAD WITHOUT CONTRAST CT CERVICAL SPINE WITHOUT CONTRAST TECHNIQUE: Multidetector CT imaging of the head and cervical spine was performed following the standard protocol without intravenous contrast. Multiplanar CT image reconstructions of the cervical spine were also generated. COMPARISON:  None. FINDINGS: CT HEAD FINDINGS Brain: No evidence of acute infarction, hemorrhage, hydrocephalus, extra-axial collection or mass lesion/mass effect. Vascular: No hyperdense vessel or unexpected calcification. Skull: Normal. Negative for fracture or focal lesion. Sinuses/Orbits: No acute finding. Other: None. CT CERVICAL SPINE FINDINGS Alignment: Normal. Skull base and vertebrae: No acute fracture. No primary bone lesion or focal pathologic process. Soft tissues and spinal canal: No prevertebral fluid or swelling. No visible canal hematoma. Disc levels:  Normal. Upper chest: Negative. Other: Extensive amount of subcutaneous emphysema is noted in the cervical soft tissues which is concerning for extension from pneumomediastinum. IMPRESSION: Normal head CT. No evidence of cervical spine fracture or spondylolisthesis. Extensive amount of subcutaneous emphysema is noted in the cervical soft tissues which is concerning for extension from pneumomediastinum. Correlation with chest radiograph or CT scan is recommended. Electronically Signed   By: Lupita RaiderJames  Green Jr, M.D.   On: 10/26/2018 16:09   Ct Chest W Contrast  Result Date: 10/26/2018 CLINICAL DATA:  Blunt abdominal trauma. EXAM: CT CHEST, ABDOMEN, AND PELVIS WITH CONTRAST TECHNIQUE:  Multidetector CT imaging of the chest, abdomen and pelvis was performed following the standard protocol during bolus administration of intravenous contrast. CONTRAST:  100mL OMNIPAQUE IOHEXOL 300 MG/ML  SOLN COMPARISON:  CT scan of May 18, 2015. FINDINGS: CT CHEST FINDINGS Cardiovascular: No significant vascular findings. Normal heart size. No pericardial effusion. Mediastinum/Nodes: Extensive pneumomediastinum is noted which extends into the supraclavicular regions bilaterally. Gas is also seen extending into the pre manubrial soft tissues. Thyroid gland is unremarkable. Lungs/Pleura: No pleural effusion is noted. Minimal pneumothorax is noted in the right major fissure which most likely represents extension of previously described pneumomediastinum. No definite consolidative process is noted in either lung. Musculoskeletal: No chest wall mass or suspicious bone lesions identified. CT ABDOMEN PELVIS FINDINGS Hepatobiliary: No focal liver abnormality is seen. No gallstones, gallbladder wall thickening, or biliary dilatation. Pancreas: Unremarkable. No pancreatic ductal dilatation or surrounding inflammatory changes. Spleen: Normal in size without focal abnormality. Adrenals/Urinary Tract: Adrenal glands and kidneys appear normal. No hydronephrosis or renal obstruction is noted. Moderate diffuse urinary bladder wall thickening is noted which may represent lack of distension, but cystitis cannot be excluded. No renal or ureteral calculi are noted. Stomach/Bowel: The stomach appears normal. There is no evidence of bowel obstruction or inflammation. Status post appendectomy. Vascular/Lymphatic: No significant vascular findings are present. No enlarged abdominal or pelvic lymph nodes. Reproductive: Prostate is unremarkable. Other: No abdominal wall hernia or abnormality. No abdominopelvic ascites. Musculoskeletal: No acute or significant osseous findings. IMPRESSION: Extensive pneumomediastinum is noted which extends  into the supraclavicular regions bilaterally. Gas is also seen extending into the premanubrial soft tissues. Minimal amount of pneumothorax is noted in the right major fissure which most likely represents  extension of pneumomediastinum. These findings are concerning for possible tracheal, bronchial or esophageal injury or perforation. Clinical correlation is recommended. Moderate diffuse urinary bladder wall thickening is noted which may represent lack of distension, but cystitis cannot be excluded. Clinical correlation is recommended. No other abnormality seen in the abdomen or pelvis. Electronically Signed   By: Lupita Raider, M.D.   On: 10/26/2018 18:52   Ct Cervical Spine Wo Contrast  Result Date: 10/26/2018 CLINICAL DATA:  Altered level of consciousness. EXAM: CT HEAD WITHOUT CONTRAST CT CERVICAL SPINE WITHOUT CONTRAST TECHNIQUE: Multidetector CT imaging of the head and cervical spine was performed following the standard protocol without intravenous contrast. Multiplanar CT image reconstructions of the cervical spine were also generated. COMPARISON:  None. FINDINGS: CT HEAD FINDINGS Brain: No evidence of acute infarction, hemorrhage, hydrocephalus, extra-axial collection or mass lesion/mass effect. Vascular: No hyperdense vessel or unexpected calcification. Skull: Normal. Negative for fracture or focal lesion. Sinuses/Orbits: No acute finding. Other: None. CT CERVICAL SPINE FINDINGS Alignment: Normal. Skull base and vertebrae: No acute fracture. No primary bone lesion or focal pathologic process. Soft tissues and spinal canal: No prevertebral fluid or swelling. No visible canal hematoma. Disc levels:  Normal. Upper chest: Negative. Other: Extensive amount of subcutaneous emphysema is noted in the cervical soft tissues which is concerning for extension from pneumomediastinum. IMPRESSION: Normal head CT. No evidence of cervical spine fracture or spondylolisthesis. Extensive amount of subcutaneous emphysema  is noted in the cervical soft tissues which is concerning for extension from pneumomediastinum. Correlation with chest radiograph or CT scan is recommended. Electronically Signed   By: Lupita Raider, M.D.   On: 10/26/2018 16:09   Ct Abdomen Pelvis W Contrast  Result Date: 10/26/2018 CLINICAL DATA:  Blunt abdominal trauma. EXAM: CT CHEST, ABDOMEN, AND PELVIS WITH CONTRAST TECHNIQUE: Multidetector CT imaging of the chest, abdomen and pelvis was performed following the standard protocol during bolus administration of intravenous contrast. CONTRAST:  OMNIPAQUE IOHEXOL 300 MG/ML  SOLN COMPARISON:  CT scan of May 18, 2015. FINDINGS: CT CHEST FINDINGS Cardiovascular: No significant vascular findings. Normal heart size. No pericardial effusion. Mediastinum/Nodes: Extensive pneumomediastinum is noted which extends into the supraclavicular regions bilaterally. Gas is also seen extending into the pre manubrial soft tissues. Thyroid gland is unremarkable. Lungs/Pleura: No pleural effusion is noted. Minimal pneumothorax is noted in the right major fissure which most likely represents extension of previously described pneumomediastinum. No definite consolidative process is noted in either lung. Musculoskeletal: No chest wall mass or suspicious bone lesions identified. CT ABDOMEN PELVIS FINDINGS Hepatobiliary: No focal liver abnormality is seen. No gallstones, gallbladder wall thickening, or biliary dilatation. Pancreas: Unremarkable. No pancreatic ductal dilatation or surrounding inflammatory changes. Spleen: Normal in size without focal abnormality. Adrenals/Urinary Tract: Adrenal glands and kidneys appear normal. No hydronephrosis or renal obstruction is noted. Moderate diffuse urinary bladder wall thickening is noted which may represent lack of distension, but cystitis cannot be excluded. No renal or ureteral calculi are noted. Stomach/Bowel: The stomach appears normal. There is no evidence of bowel obstruction  or inflammation. Status post appendectomy. Vascular/Lymphatic: No significant vascular findings are present. No enlarged abdominal or pelvic lymph nodes. Reproductive: Prostate is unremarkable. Other: No abdominal wall hernia or abnormality. No abdominopelvic ascites. Musculoskeletal: No acute or significant osseous findings. IMPRESSION: Extensive pneumomediastinum is noted which extends into the supraclavicular regions bilaterally. Gas is also seen extending into the premanubrial soft tissues. Minimal amount of pneumothorax is noted in the right major fissure which most likely  represents extension of pneumomediastinum. These findings are concerning for possible tracheal, bronchial or esophageal injury or perforation. Clinical correlation is recommended. Moderate diffuse urinary bladder wall thickening is noted which may represent lack of distension, but cystitis cannot be excluded. Clinical correlation is recommended. No other abnormality seen in the abdomen or pelvis. Electronically Signed   By: Lupita Raider, M.D.   On: 10/26/2018 18:52   I have independently reviewed the above radiology studies  and reviewed the findings with theER MD.   No indication at this point for surgical intervention. Would keep npo until able to evaluate further and consider esophageal swallow. Chest xray in am No indication to place chest tube Will follow with you. I have seen and examined Dalton Rosario and agree with the above assessment  and plan.  Delight Ovens MD Beeper [phone] Office [phone] 10/26/2018 8:40 PM

## 2018-10-27 ENCOUNTER — Inpatient Hospital Stay (HOSPITAL_COMMUNITY): Payer: Medicaid Other

## 2018-10-27 DIAGNOSIS — J982 Interstitial emphysema: Secondary | ICD-10-CM

## 2018-10-27 LAB — RAPID URINE DRUG SCREEN, HOSP PERFORMED
Amphetamines: NOT DETECTED
Barbiturates: NOT DETECTED
Benzodiazepines: POSITIVE — AB
Cocaine: NOT DETECTED
Opiates: NOT DETECTED
Tetrahydrocannabinol: POSITIVE — AB

## 2018-10-27 MED ORDER — LORAZEPAM 1 MG PO TABS: 1.0000 mg | ORAL_TABLET | ORAL | Status: DC | PRN

## 2018-10-27 MED ORDER — LORAZEPAM 2 MG/ML IJ SOLN
1.0000 mg | INTRAMUSCULAR | Status: DC | PRN
  Administered 2018-10-28 (×2): 1 mg via INTRAMUSCULAR
  Filled 2018-10-27 (×2): qty 1

## 2018-10-27 NOTE — Progress Notes (Signed)
PROGRESS NOTE    Dalton Rosario  ZOX:[id number] DOB: 11/13/1995 DOA: 10/26/2018 PCP: Rehabilitation, [facility]    Brief Narrative:  23 year old male who presented with epistaxis.  He does have significant past medical history of depression, PTSD and ADHD, apparently he was found sitting outside with a bloody nose.  Unclear further details, apparently in the emergency department patient was having bizarre behavior, uncooperative and threatening to leave the hospital.  Patient was placed under involuntary commitment, received physical and chemical restraints.  On his initial physical examination his blood pressure was 136/67, heart rate 88, respiratory rate 14, oxygen saturation 97%, he was somnolent, positive subcutaneous crepitus along the clavicular lines bilaterally, his lungs were clear to auscultation bilaterally, heart S1-S2 present and rhythmic, his abdomen was soft, no lower extremity edema.  Sodium 139, potassium 3.1, chloride 104, bicarb 21, glucose 101, BUN 12, creatinine 0.99, white count 8.1, hemoglobin 13.5, hematocrit 38.5, platelets 258, alcohol less than 10, salicylate less than 7, his a chest x-ray had clear lung fields, no pneumothorax, positive pneumomediastinum with subcutaneous air in the supraclavicular regions bilaterally.  Chest CT with extensive pneumomediastinum which extends into the supraclavicular regions bilaterally.  Minimal amount of pneumothorax noted in the right major fissure.   Patient was admitted to the hospital with working diagnosis of small spontaneous right pneumothorax complicated by pneumomediastinum.   Assessment & Plan:   Principal Problem:   Pneumomediastinum (HCC) Active Problems:   MDD (major depressive disorder), recurrent episode, moderate (HCC)   Toxic encephalopathy   Hypokalemia   1. Spontaneous small right pneumothorax complicated with pneumomediastinum. Patient with no respiratory distress or chest pain,  his oxygenation is 100 on room air. No surgical intervention recommended per Dr. Tyrone SageGerhardt from CT surgery. Follow up chest film is improving. No further intervention at this time. Will advance diet to clear liquids for now.   2. Psychosis with baseline depression and PTSD. Patient with disorganized thinking, agitation and psychotic features. Continue involuntary commitment, with one to one sitter precautions. Will follow with further psychiatric recommendations. Will add as needed lorazepam.   3. Hypokalemia. K 3,1, this am with preserved renal function, will order 80 meq of Kcl in 2 divided doses and will follow on renal panel in am.      DVT prophylaxis: out of bed   Code Status:  full Family Communication: no family at the bedside  Disposition Plan/ discharge barriers: pending psychiatric evaluation   There is no height or weight on file to calculate BMI. Malnutrition Type:      Malnutrition Characteristics:      Nutrition Interventions:     RN Pressure Injury Documentation:     Consultants:   Psychiatry   Procedures:     Antimicrobials:       Subjective: Patient very confused and disorientated, not allowing examination or giving any meaningful history. Can me aggressive.   Objective: Vitals:   10/26/18 1900 10/26/18 2000 10/26/18 2100 10/27/18 0610  BP: (!) 135/57 (!) 109/55 122/63 113/76  Pulse: 69 (!) 54 77 75  Resp: 15 13 15 18   Temp:    98.5 F (36.9 C)  TempSrc:    Oral  SpO2: 97% 100% 100% 100%   No intake or output data in the 24 hours ending 10/27/18 1059 There were no vitals filed for this visit.  Examination:   General: disorientated, disorganized thinking.  He does not look in respiratory distress or pain. Not allowing proper physical examination.  Data Reviewed: I have personally reviewed following labs and imaging studies  CBC: Recent Labs  Lab 10/26/18 1411  WBC 8.1  NEUTROABS 4.8  HGB 13.5  HCT 38.5*  MCV 88.1   PLT 258   Basic Metabolic Panel: Recent Labs  Lab 10/26/18 1411  NA 139  K 3.1*  CL 104  CO2 21*  GLUCOSE 101*  BUN 12  CREATININE 0.99  CALCIUM 9.1   GFR: CrCl cannot be calculated (Unknown ideal weight.). Liver Function Tests: Recent Labs  Lab 10/26/18 1411  AST 27  ALT 26  ALKPHOS 52  BILITOT 1.2  PROT 7.2  ALBUMIN 4.3   No results for input(s): LIPASE, AMYLASE in the last 168 hours. No results for input(s): AMMONIA in the last 168 hours. Coagulation Profile: No results for input(s): INR, PROTIME in the last 168 hours. Cardiac Enzymes: No results for input(s): CKTOTAL, CKMB, CKMBINDEX, TROPONINI in the last 168 hours. BNP (last 3 results) No results for input(s): PROBNP in the last 8760 hours. HbA1C: No results for input(s): HGBA1C in the last 72 hours. CBG: No results for input(s): GLUCAP in the last 168 hours. Lipid Profile: No results for input(s): CHOL, HDL, LDLCALC, TRIG, CHOLHDL, LDLDIRECT in the last 72 hours. Thyroid Function Tests: No results for input(s): TSH, T4TOTAL, FREET4, T3FREE, THYROIDAB in the last 72 hours. Anemia Panel: No results for input(s): VITAMINB12, FOLATE, FERRITIN, TIBC, IRON, RETICCTPCT in the last 72 hours.    Radiology Studies: I have reviewed all of the imaging during this hospital visit personally     Scheduled Meds: Continuous Infusions: . sodium chloride 250 mL (10/26/18 2326)     LOS: 1 day        Mauricio Annett Gula, MD

## 2018-10-27 NOTE — Progress Notes (Addendum)
Received this patient, Dalton Rosario to room 1520. No acute distress noted. Patient is uncooperative and somnolent at this time and is not able to provide history. See flowsheet for assessment and safety. Sitter for 1:1 provided for safety.

## 2018-10-27 NOTE — Plan of Care (Signed)
  Problem: Clinical Measurements: Goal: Will remain free from infection Outcome: Progressing Goal: Diagnostic test results will improve Outcome: Progressing Goal: Respiratory complications will improve Outcome: Progressing Goal: Cardiovascular complication will be avoided Outcome: Progressing   Problem: Nutrition: Goal: Adequate nutrition will be maintained Outcome: Progressing   Problem: Elimination: Goal: Will not experience complications related to urinary retention Outcome: Progressing   Problem: Education: Goal: Knowledge of General Education information will improve Description Including pain rating scale, medication(s)/side effects and non-pharmacologic comfort measures Outcome: Not Progressing   Problem: Health Behavior/Discharge Planning: Goal: Ability to manage health-related needs will improve Outcome: Not Progressing   Problem: Clinical Measurements: Goal: Ability to maintain clinical measurements within normal limits will improve Outcome: Not Progressing   Problem: Activity: Goal: Risk for activity intolerance will decrease Outcome: Not Progressing   Problem: Coping: Goal: Level of anxiety will decrease Outcome: Not Progressing   Problem: Elimination: Goal: Will not experience complications related to bowel motility Outcome: Not Progressing   Problem: Pain Managment: Goal: General experience of comfort will improve Outcome: Not Progressing   Problem: Safety: Goal: Ability to remain free from injury will improve Outcome: Not Progressing   Problem: Skin Integrity: Goal: Risk for impaired skin integrity will decrease Outcome: Not Progressing

## 2018-10-27 NOTE — Progress Notes (Signed)
Patient has continued to be agitated intermittently.  Unable to assess orientation.  Answers to his name but is non compliant with other assessments.  At one point today the patient sat down in the corner and stared at the wall.  We were unable to convince him to get to chair or bed.  Security was notified and came to the room.  He allowed them to assist him to his feet and sit in the chair.  He continuously takes very deep breathes and holds it for extended periods of time.  He also expresses all of his air out and holds that position for periods of time.  He thinks this takes "all the energy and oxygen from the air all for me".   At some points he is happy, singing and laughing.  Other times he is angry.  He hits his self in the body and head trying "to put them to sleep so they wont bother him".  He participated in some hygiene with the assist of the sitter.  He has slept for approximately an hour altogether today.   He was ordered a clear liquid diet and tolerated it well.  At approximately 1700 he finally urinated 1100 ml dark yellow urine and a specimen was sent to the lab.  Sitter is at bedside for safety.

## 2018-10-27 NOTE — Progress Notes (Signed)
Patient ID: Dalton Rosario, male   DOB: 08/18/1995, 23 y.o.   MRN: [id number]      [address]       [place] [id number]             [phone]                    Dalton Rosario Date of Birth: 03/25/1996  Referring: Dr Dominga [facility] Primary Care: [facility], [facility] Physical [facility] & Primary Cardiologist: No primary care provider on file.  Chief Complaint:    Chief Complaint  Patient presents with   IVC   Medical Clearance    History of Present Illness:    Dalton Rosario 23 y.o. male is seen in hospital today after presenting to the emergency room last night with pneumomediastinum.  Today the patient is awake alert but refuses to be involved in his care at all.  Would not even confirm his name identity or birthday when asked.  Nonverbal, he refused exam    Current Activity/ Functional Status:  Patient is independent with mobility/ambulation, transfers, ADL's, IADL's.   Zubrod Score: At the time of surgery this patients most appropriate activity status/level should be described as: Unknown []     0    Normal activity, no symptoms []     1    Restricted in physical strenuous activity but ambulatory, able to do out light work []     2    Ambulatory and capable of self care, unable to do work activities, up and about               >50 % of waking hours                              []     3    Only limited self care, in bed greater than 50% of waking hours []     4    Completely disabled, no self care, confined to bed or chair []     5    Moribund   Past Medical History:  Diagnosis Date   ADHD (attention deficit hyperactivity disorder)    Anxiety    Asthma    Depression     Past Surgical History:  Procedure Laterality Date   APPENDECTOMY     Pt was 23 yo    History reviewed. No pertinent family history.   Social History   Tobacco Use  Smoking Status Current Every Day Smoker   Packs/day:  0.50   Types: Cigarettes  Smokeless Tobacco Never Used    Social History   Substance and Sexual Activity  Alcohol Use No     No Known Allergies  Current Facility-Administered Medications  Medication Dose Route Frequency Provider Last Rate Last Dose   0.9 %  sodium chloride infusion   Intravenous PRN Candelaria StagersGonfa, Taye T, MD 10 mL/hr at 10/26/18 2326 250 mL at 10/26/18 2326    Unobtainable as the patient is not cooperative   Review of Systems:     Cardiac Review of Systems: [Y] = yes  or   [ N ] = no   Chest Pain [    ]  Resting SOB [   ] Exertional SOB  [  ]  Orthopnea [  ]   Pedal Edema [   ]    Palpitations [  ] Syncope  [  ]  Presyncope [   ]   General Review of Systems: [Y] = yes [  ]=no Constitional: recent weight change [  ];  Wt loss over the last 3 months [   ] anorexia [  ]; fatigue [  ]; nausea [  ]; night sweats [  ]; fever [  ]; or chills [  ];           Eye : blurred vision [  ]; diplopia [   ]; vision changes [  ];  Amaurosis fugax[  ]; Resp: cough [  ];  wheezing[  ];  hemoptysis[  ]; shortness of breath[  ]; paroxysmal nocturnal dyspnea[  ]; dyspnea on exertion[  ]; or orthopnea[  ];  GI:  gallstones[  ], vomiting[  ];  dysphagia[  ]; melena[  ];  hematochezia [  ]; heartburn[  ];   Hx of  Colonoscopy[  ]; GU: kidney stones [  ]; hematuria[  ];   dysuria [  ];  nocturia[  ];  history of     obstruction [  ]; urinary frequency [  ]             Skin: rash, swelling[  ];, hair loss[  ];  peripheral edema[  ];  or itching[  ]; Musculosketetal: myalgias[  ];  joint swelling[  ];  joint erythema[  ];  joint pain[  ];  back pain[  ];  Heme/Lymph: bruising[  ];  bleeding[  ];  anemia[  ];  Neuro: TIA[  ];  headaches[  ];  stroke[  ];  vertigo[  ];  seizures[  ];   paresthesias[  ];  difficulty walking[  ];  Psych:depression[  ]; anxiety[  ];  Endocrine: diabetes[  ];  thyroid dysfunction[  ];  Immunizations: Flu up to date [  ]; Pneumococcal up to date [   ];  Other:     PHYSICAL EXAMINATION: BP 113/76    Pulse 75    Temp 98.5 F (36.9 C) (Oral)    Resp 18    SpO2 100%  Patient refused exam  Diagnostic Studies & Laboratory data:     Recent Radiology Findings:   Ct Head Wo Contrast  Result Date: 10/26/2018 CLINICAL DATA:  Altered level of consciousness. EXAM: CT HEAD WITHOUT CONTRAST CT CERVICAL SPINE WITHOUT CONTRAST TECHNIQUE: Multidetector CT imaging of the head and cervical spine was performed following the standard protocol without intravenous contrast. Multiplanar CT image reconstructions of the cervical spine were also generated. COMPARISON:  None. FINDINGS: CT HEAD FINDINGS Brain: No evidence of acute infarction, hemorrhage, hydrocephalus, extra-axial collection or mass lesion/mass effect. Vascular: No hyperdense vessel or unexpected calcification. Skull: Normal. Negative for fracture or focal lesion. Sinuses/Orbits: No acute finding. Other: None. CT CERVICAL SPINE FINDINGS Alignment: Normal. Skull base and vertebrae: No acute fracture. No primary bone lesion or focal pathologic process. Soft tissues and spinal canal: No prevertebral fluid or swelling. No visible canal hematoma. Disc levels:  Normal. Upper chest: Negative. Other: Extensive amount of subcutaneous emphysema is noted in the cervical soft tissues which is concerning for extension from pneumomediastinum. IMPRESSION: Normal head CT. No evidence of cervical spine fracture or spondylolisthesis. Extensive amount of subcutaneous emphysema is noted in the cervical soft tissues which is concerning for extension from pneumomediastinum. Correlation with chest radiograph or CT scan is recommended. Electronically Signed   By: Lupita Raider, M.D.   On: 10/26/2018 16:09   Ct Chest W Contrast  Result Date: 10/26/2018 CLINICAL DATA:  Blunt abdominal trauma. EXAM: CT CHEST, ABDOMEN, AND PELVIS WITH CONTRAST TECHNIQUE: Multidetector CT imaging of the chest, abdomen and pelvis was performed  following the standard protocol during bolus administration of intravenous contrast. CONTRAST:  OMNIPAQUE IOHEXOL 300 MG/ML  SOLN COMPARISON:  CT scan of May 18, 2015. FINDINGS: CT CHEST FINDINGS Cardiovascular: No significant vascular findings. Normal heart size. No pericardial effusion. Mediastinum/Nodes: Extensive pneumomediastinum is noted which extends into the supraclavicular regions bilaterally. Gas is also seen extending into the pre manubrial soft tissues. Thyroid gland is unremarkable. Lungs/Pleura: No pleural effusion is noted. Minimal pneumothorax is noted in the right major fissure which most likely represents extension of previously described pneumomediastinum. No definite consolidative process is noted in either lung. Musculoskeletal: No chest wall mass or suspicious bone lesions identified. CT ABDOMEN PELVIS FINDINGS Hepatobiliary: No focal liver abnormality is seen. No gallstones, gallbladder wall thickening, or biliary dilatation. Pancreas: Unremarkable. No pancreatic ductal dilatation or surrounding inflammatory changes. Spleen: Normal in size without focal abnormality. Adrenals/Urinary Tract: Adrenal glands and kidneys appear normal. No hydronephrosis or renal obstruction is noted. Moderate diffuse urinary bladder wall thickening is noted which may represent lack of distension, but cystitis cannot be excluded. No renal or ureteral calculi are noted. Stomach/Bowel: The stomach appears normal. There is no evidence of bowel obstruction or inflammation. Status post appendectomy. Vascular/Lymphatic: No significant vascular findings are present. No enlarged abdominal or pelvic lymph nodes. Reproductive: Prostate is unremarkable. Other: No abdominal wall hernia or abnormality. No abdominopelvic ascites. Musculoskeletal: No acute or significant osseous findings. IMPRESSION: Extensive pneumomediastinum is noted which extends into the supraclavicular regions bilaterally. Gas is also seen  extending into the premanubrial soft tissues. Minimal amount of pneumothorax is noted in the right major fissure which most likely represents extension of pneumomediastinum. These findings are concerning for possible tracheal, bronchial or esophageal injury or perforation. Clinical correlation is recommended. Moderate diffuse urinary bladder wall thickening is noted which may represent lack of distension, but cystitis cannot be excluded. Clinical correlation is recommended. No other abnormality seen in the abdomen or pelvis. Electronically Signed   By: Lupita Raider, M.D.   On: 10/26/2018 18:52   Ct Cervical Spine Wo Contrast  Result Date: 10/26/2018 CLINICAL DATA:  Altered level of consciousness. EXAM: CT HEAD WITHOUT CONTRAST CT CERVICAL SPINE WITHOUT CONTRAST TECHNIQUE: Multidetector CT imaging of the head and cervical spine was performed following the standard protocol without intravenous contrast. Multiplanar CT image reconstructions of the cervical spine were also generated. COMPARISON:  None. FINDINGS: CT HEAD FINDINGS Brain: No evidence of acute infarction, hemorrhage, hydrocephalus, extra-axial collection or mass lesion/mass effect. Vascular: No hyperdense vessel or unexpected calcification. Skull: Normal. Negative for fracture or focal lesion. Sinuses/Orbits: No acute finding. Other: None. CT CERVICAL SPINE FINDINGS Alignment: Normal. Skull base and vertebrae: No acute fracture. No primary bone lesion or focal pathologic process. Soft tissues and spinal canal: No prevertebral fluid or swelling. No visible canal hematoma. Disc levels:  Normal. Upper chest: Negative. Other: Extensive amount of subcutaneous emphysema is noted in the cervical soft tissues which is concerning for extension from pneumomediastinum. IMPRESSION: Normal head CT. No evidence of cervical spine fracture or spondylolisthesis. Extensive amount of subcutaneous emphysema is noted in the cervical soft tissues which is concerning for  extension from pneumomediastinum. Correlation with chest radiograph or CT scan is recommended. Electronically Signed   By: Lupita Raider, M.D.   On: 10/26/2018 16:09   Ct Abdomen Pelvis W  Contrast  Result Date: 10/26/2018 CLINICAL DATA:  Blunt abdominal trauma. EXAM: CT CHEST, ABDOMEN, AND PELVIS WITH CONTRAST TECHNIQUE: Multidetector CT imaging of the chest, abdomen and pelvis was performed following the standard protocol during bolus administration of intravenous contrast. CONTRAST:  OMNIPAQUE IOHEXOL 300 MG/ML  SOLN COMPARISON:  CT scan of May 18, 2015. FINDINGS: CT CHEST FINDINGS Cardiovascular: No significant vascular findings. Normal heart size. No pericardial effusion. Mediastinum/Nodes: Extensive pneumomediastinum is noted which extends into the supraclavicular regions bilaterally. Gas is also seen extending into the pre manubrial soft tissues. Thyroid gland is unremarkable. Lungs/Pleura: No pleural effusion is noted. Minimal pneumothorax is noted in the right major fissure which most likely represents extension of previously described pneumomediastinum. No definite consolidative process is noted in either lung. Musculoskeletal: No chest wall mass or suspicious bone lesions identified. CT ABDOMEN PELVIS FINDINGS Hepatobiliary: No focal liver abnormality is seen. No gallstones, gallbladder wall thickening, or biliary dilatation. Pancreas: Unremarkable. No pancreatic ductal dilatation or surrounding inflammatory changes. Spleen: Normal in size without focal abnormality. Adrenals/Urinary Tract: Adrenal glands and kidneys appear normal. No hydronephrosis or renal obstruction is noted. Moderate diffuse urinary bladder wall thickening is noted which may represent lack of distension, but cystitis cannot be excluded. No renal or ureteral calculi are noted. Stomach/Bowel: The stomach appears normal. There is no evidence of bowel obstruction or inflammation. Status post appendectomy.  Vascular/Lymphatic: No significant vascular findings are present. No enlarged abdominal or pelvic lymph nodes. Reproductive: Prostate is unremarkable. Other: No abdominal wall hernia or abnormality. No abdominopelvic ascites. Musculoskeletal: No acute or significant osseous findings. IMPRESSION: Extensive pneumomediastinum is noted which extends into the supraclavicular regions bilaterally. Gas is also seen extending into the premanubrial soft tissues. Minimal amount of pneumothorax is noted in the right major fissure which most likely represents extension of pneumomediastinum. These findings are concerning for possible tracheal, bronchial or esophageal injury or perforation. Clinical correlation is recommended. Moderate diffuse urinary bladder wall thickening is noted which may represent lack of distension, but cystitis cannot be excluded. Clinical correlation is recommended. No other abnormality seen in the abdomen or pelvis. Electronically Signed   By: Lupita Raider, M.D.   On: 10/26/2018 18:52   Portable Chest 1 View  Result Date: 10/27/2018 CLINICAL DATA:  Initial evaluation for pneumomediastinum. EXAM: PORTABLE CHEST 1 VIEW COMPARISON:  Prior CT from 10/26/2018. FINDINGS: Cardiac and mediastinal silhouettes are stable in size and contour, and remain within normal limits. Persistent trace pneumomediastinum, most notable at the level of the AP window. Associated soft tissue emphysema present within the lower neck bilaterally overall, degree of pneumomediastinum is likely slightly decreased from previous. Lungs mildly hyperinflated. No focal infiltrates, pulmonary edema, or pleural effusion. No pneumothorax. No acute osseous finding. IMPRESSION: 1. Persistent small volume pneumomediastinum with associated soft tissue emphysema within the lower neck bilaterally. Overall, degree of pneumomediastinum appears somewhat decreased from prior. 2. No other new active cardiopulmonary disease. Electronically Signed    By: Rise Mu M.D.   On: 10/27/2018 05:10   Dg Chest Port 1 View  Result Date: 10/26/2018 CLINICAL DATA:  Shortness of breath. EXAM: PORTABLE CHEST 1 VIEW COMPARISON:  Radiographs of August 06, 2015. FINDINGS: Extensive pneumomediastinum is noted with subcutaneous emphysema is seen in both supraclavicular regions. No definite pneumothorax is noted. No pleural effusion is noted. No pulmonary opacity is noted. Bony thorax is unremarkable. IMPRESSION: Pneumomediastinum is noted with subcutaneous emphysema seen in both supraclavicular regions. This is concerning for possible esophageal or bronchial rupture  or injury. Electronically Signed   By: Lupita Raider, M.D.   On: 10/26/2018 17:39     I have independently reviewed the above radiology studies  and reviewed the findings with the patient.   Recent Lab Findings: Lab Results  Component Value Date   WBC 8.1 10/26/2018   HGB 13.5 10/26/2018   HCT 38.5 (L) 10/26/2018   PLT 258 10/26/2018   GLUCOSE 101 (H) 10/26/2018   CHOL 201 (H) 06/21/2012   TRIG 207 (H) 06/21/2012   HDL 42 06/21/2012   LDLCALC 118 (H) 06/21/2012   ALT 26 10/26/2018   AST 27 10/26/2018   NA 139 10/26/2018   K 3.1 (L) 10/26/2018   CL 104 10/26/2018   CREATININE 0.99 10/26/2018   BUN 12 10/26/2018   CO2 21 (L) 10/26/2018   TSH 1.524 06/21/2012   HGBA1C 5.4 06/21/2012      Assessment / Plan:   Patient with pneumomediastinum totally uncooperative with any of his care, to the point of not even been able to obtain any history or physical exam. Radiographic evidence shows pneumomediastinum without definite cause.  Currently the patient does not appear toxic as you would assume he would be with major tracheal or esophageal injury. Portable chest x-ray this morning shows decreasing subcutaneous air. No indication for surgical treatment at this time Will currently sign off as there is little we can do until the patient becomes cooperative with his care        Delight Ovens MD      [address] [place] [place] [address] Office [phone]   Beeper [phone]  10/27/2018 11:00 AM

## 2018-10-27 NOTE — Progress Notes (Signed)
Dalton Rosario awake this am. Patient is oriented to self, place, time and situation. This patient remains uncooperative answering questions concerning H&P and became erratic. NO acute distress noted. 1:1 sitter remains at bedside for patient safety.

## 2018-10-27 NOTE — Progress Notes (Addendum)
I did spoke over the phone with patient's contact: His foster mother, Mrs. Nilda Simmer.  She told me that the patient lately has been homeless, he does have high levels anxiety, and he has been medicated in the past.  I informed her about patient  being stable in the hospital but currently under involuntary commitment due to safety.  She agrees with this plan, and she asked to be updated.

## 2018-10-28 DIAGNOSIS — F23 Brief psychotic disorder: Secondary | ICD-10-CM

## 2018-10-28 MED ORDER — HALOPERIDOL LACTATE 5 MG/ML IJ SOLN
1.0000 mg | Freq: Four times a day (QID) | INTRAMUSCULAR | Status: DC | PRN
  Administered 2018-10-28: 1 mg via INTRAMUSCULAR
  Filled 2018-10-28: qty 1

## 2018-10-28 MED ORDER — OLANZAPINE 5 MG PO TABS
5.0000 mg | ORAL_TABLET | Freq: Two times a day (BID) | ORAL | Status: DC
  Administered 2018-10-28 – 2018-10-29 (×2): 5 mg via ORAL
  Filled 2018-10-28 (×2): qty 1

## 2018-10-28 MED ORDER — OLANZAPINE 10 MG IM SOLR
10.0000 mg | Freq: Two times a day (BID) | INTRAMUSCULAR | Status: DC | PRN
  Filled 2018-10-28: qty 10
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
## 2018-10-28 NOTE — Progress Notes (Signed)
Patient walked out of room stating he was leaving and no one can make him stay.  He proceeded to walk up the hallway despite RN and NT attempting to redirect patient and explain that he is IVC and cannot leave the hospital at this time.  All attempts to deescalate the situation were made, however patient continued to become aggressive and vocal.  Security and [employer] were called to the unit to assist when it became apparent that the patient would not return to his room and was becoming increasingly agitated and escalating his aggression towards the staff.  Upon arrival, security and [employer] escorted the patient to his room where RN attempted to speak with patient about the importance of staying in his room and the need to take prescribed medications as well as change his clothing into disposable scrubs provided by the hospital.  Patient was uncooperative and unwilling to take medication despite numerous attempts by RN and security to speak with patient and illicit his cooperation.  As patient became increasingly aggressive and agitated, security and [employer] had to help hold and stabilize the patient in order for RN to administer medications.  Patient repeated that he did not want to be tied down and RN explained that if he would cooperate and remain calm there would be no need to tie him down.  Patient eventually calmed down and his clothing was removed and placed in a patient belongings bag, labeled with his name and placed at the nurses desk to be returned upon transfer or discharge. Patient environment was checked for any items that needed to removed for patient safety.  Will continue to monitor.

## 2018-10-28 NOTE — Discharge Summary (Signed)
Physician Discharge Summary  Dalton Rosario ZOX:[id number] DOB: 17-Jun-1996 DOA: 10/26/2018  PCP: [facility]  Admit date: 10/26/2018 Discharge date: 10/28/2018  Admitted From: Home (homeless)  Disposition:  Inpatient psychiatric unit  Recommendations for Outpatient Follow-up and new medication changes:  1. Follow up with Primary care as outpatient.  2. Patient is medically stable for transfer to inpatient psychiatry.  Home Health: no   Equipment/Devices: no    Discharge Condition: stable  CODE STATUS: full  Diet recommendation: Regular.   Brief/Interim Summary: 23 year old male who presented with epistaxis.  He does have significant past medical history of depression, PTSD and ADHD, apparently he was found sitting outside with a bloody nose.  Unclear further details, apparently in the emergency department patient was having bizarre behavior, uncooperative and threatening to leave the hospital.  Patient was placed under involuntary commitment, received physical and chemical restraints.  On his initial physical examination his blood pressure was 136/67, heart rate 88, respiratory rate 14, oxygen saturation 97%, he was somnolent, positive subcutaneous crepitus along the clavicular lines bilaterally, his lungs were clear to auscultation bilaterally, heart S1-S2 present and rhythmic, his abdomen was soft, no lower extremity edema.  Sodium 139, potassium 3.1, chloride 104, bicarb 21, glucose 101, BUN 12, creatinine 0.99, white count 8.1, hemoglobin 13.5, hematocrit 38.5, platelets 258, alcohol less than 10, salicylate less than 7, his a chest x-ray had clear lung fields, no pneumothorax, positive pneumomediastinum with subcutaneous air in the supraclavicular regions bilaterally.  Chest CT with extensive pneumomediastinum which extends into the supraclavicular regions bilaterally.  Minimal amount of pneumothorax noted in the right major fissure.   Patient  was admitted to the hospital with working diagnosis of small spontaneous right pneumothorax complicated by pneumomediastinum.   1.  Spontaneous small right pneumothorax complicated by pneumomediastinum.  Patient was admitted to the medical ward, he was placed on supplemental oxygen per nasal cannula, his oxygenation remained stable.  He had a follow-up chest film with improvement of pneumomediastinum.  Patient denied any dyspnea, his oxygen saturation was 97% on room air.  Patient was seen by cardiothoracic surgery, recommendations to continue conservative management.  No indication for invasive treatment.   2.  Hypokalemia.  Patient received potassium chloride with good toleration.  3.  Psychosis with baseline depression and PTSD.  Patient had psychotic features while hospitalized, he required one-to-one sitter, psychiatry was consulted, recommendations to transfer patient to inpatient psychiatry.   Discharge Diagnoses:  Principal Problem:   Pneumomediastinum (HCC) Active Problems:   MDD (major depressive disorder), recurrent episode, moderate (HCC)   Toxic encephalopathy   Hypokalemia    Discharge Instructions   Allergies as of 10/28/2018   No Known Allergies     Medication List    STOP taking these medications   acetaminophen 650 MG CR tablet Commonly known as:  Tylenol 8 Hour   ARIPiprazole 15 MG tablet Commonly known as:  ABILIFY   belladonna-opium 16.2-30 MG suppository Commonly known as:  B&O SUPPRETTES   benzonatate 100 MG capsule Commonly known as:  TESSALON   docusate sodium 100 MG capsule Commonly known as:  Colace   escitalopram 10 MG tablet Commonly known as:  LEXAPRO   methylphenidate 36 MG CR tablet Commonly known as:  Product manager       No Known Allergies  Consultations:  Cardiothoracic surgery  Psychiatry   Procedures/Studies: Ct Head Wo Contrast  Result Date: 10/26/2018 CLINICAL DATA:  Altered level of consciousness.  EXAM: CT HEAD WITHOUT CONTRAST CT CERVICAL SPINE WITHOUT CONTRAST TECHNIQUE: Multidetector CT imaging of the head and cervical spine was performed following the standard protocol without intravenous contrast. Multiplanar CT image reconstructions of the cervical spine were also generated. COMPARISON:  None. FINDINGS: CT HEAD FINDINGS Brain: No evidence of acute infarction, hemorrhage, hydrocephalus, extra-axial collection or mass lesion/mass effect. Vascular: No hyperdense vessel or unexpected calcification. Skull: Normal. Negative for fracture or focal lesion. Sinuses/Orbits: No acute finding. Other: None. CT CERVICAL SPINE FINDINGS Alignment: Normal. Skull base and vertebrae: No acute fracture. No primary bone lesion or focal pathologic process. Soft tissues and spinal canal: No prevertebral fluid or swelling. No visible canal hematoma. Disc levels:  Normal. Upper chest: Negative. Other: Extensive amount of subcutaneous emphysema is noted in the cervical soft tissues which is concerning for extension from pneumomediastinum. IMPRESSION: Normal head CT. No evidence of cervical spine fracture or spondylolisthesis. Extensive amount of subcutaneous emphysema is noted in the cervical soft tissues which is concerning for extension from pneumomediastinum. Correlation with chest radiograph or CT scan is recommended. Electronically Signed   By: Lupita RaiderJames  Green Jr, M.D.   On: 10/26/2018 16:09   Ct Chest W Contrast  Result Date: 10/26/2018 CLINICAL DATA:  Blunt abdominal trauma. EXAM: CT CHEST, ABDOMEN, AND PELVIS WITH CONTRAST TECHNIQUE: Multidetector CT imaging of the chest, abdomen and pelvis was performed following the standard protocol during bolus administration of intravenous contrast. CONTRAST:  100mL OMNIPAQUE IOHEXOL 300 MG/ML  SOLN COMPARISON:  CT scan of May 18, 2015. FINDINGS: CT CHEST FINDINGS Cardiovascular: No significant vascular findings. Normal heart size. No pericardial effusion. Mediastinum/Nodes:  Extensive pneumomediastinum is noted which extends into the supraclavicular regions bilaterally. Gas is also seen extending into the pre manubrial soft tissues. Thyroid gland is unremarkable. Lungs/Pleura: No pleural effusion is noted. Minimal pneumothorax is noted in the right major fissure which most likely represents extension of previously described pneumomediastinum. No definite consolidative process is noted in either lung. Musculoskeletal: No chest wall mass or suspicious bone lesions identified. CT ABDOMEN PELVIS FINDINGS Hepatobiliary: No focal liver abnormality is seen. No gallstones, gallbladder wall thickening, or biliary dilatation. Pancreas: Unremarkable. No pancreatic ductal dilatation or surrounding inflammatory changes. Spleen: Normal in size without focal abnormality. Adrenals/Urinary Tract: Adrenal glands and kidneys appear normal. No hydronephrosis or renal obstruction is noted. Moderate diffuse urinary bladder wall thickening is noted which may represent lack of distension, but cystitis cannot be excluded. No renal or ureteral calculi are noted. Stomach/Bowel: The stomach appears normal. There is no evidence of bowel obstruction or inflammation. Status post appendectomy. Vascular/Lymphatic: No significant vascular findings are present. No enlarged abdominal or pelvic lymph nodes. Reproductive: Prostate is unremarkable. Other: No abdominal wall hernia or abnormality. No abdominopelvic ascites. Musculoskeletal: No acute or significant osseous findings. IMPRESSION: Extensive pneumomediastinum is noted which extends into the supraclavicular regions bilaterally. Gas is also seen extending into the premanubrial soft tissues. Minimal amount of pneumothorax is noted in the right major fissure which most likely represents extension of pneumomediastinum. These findings are concerning for possible tracheal, bronchial or esophageal injury or perforation. Clinical correlation is recommended. Moderate diffuse  urinary bladder wall thickening is noted which may represent lack of distension, but cystitis cannot be excluded. Clinical correlation is recommended. No other abnormality seen in the abdomen or pelvis. Electronically Signed   By: Lupita RaiderJames  Green Jr, M.D.   On: 10/26/2018 18:52   Ct Cervical Spine Wo Contrast  Result Date: 10/26/2018 CLINICAL DATA:  Altered level of consciousness.  EXAM: CT HEAD WITHOUT CONTRAST CT CERVICAL SPINE WITHOUT CONTRAST TECHNIQUE: Multidetector CT imaging of the head and cervical spine was performed following the standard protocol without intravenous contrast. Multiplanar CT image reconstructions of the cervical spine were also generated. COMPARISON:  None. FINDINGS: CT HEAD FINDINGS Brain: No evidence of acute infarction, hemorrhage, hydrocephalus, extra-axial collection or mass lesion/mass effect. Vascular: No hyperdense vessel or unexpected calcification. Skull: Normal. Negative for fracture or focal lesion. Sinuses/Orbits: No acute finding. Other: None. CT CERVICAL SPINE FINDINGS Alignment: Normal. Skull base and vertebrae: No acute fracture. No primary bone lesion or focal pathologic process. Soft tissues and spinal canal: No prevertebral fluid or swelling. No visible canal hematoma. Disc levels:  Normal. Upper chest: Negative. Other: Extensive amount of subcutaneous emphysema is noted in the cervical soft tissues which is concerning for extension from pneumomediastinum. IMPRESSION: Normal head CT. No evidence of cervical spine fracture or spondylolisthesis. Extensive amount of subcutaneous emphysema is noted in the cervical soft tissues which is concerning for extension from pneumomediastinum. Correlation with chest radiograph or CT scan is recommended. Electronically Signed   By: Lupita Raider, M.D.   On: 10/26/2018 16:09   Ct Abdomen Pelvis W Contrast  Result Date: 10/26/2018 CLINICAL DATA:  Blunt abdominal trauma. EXAM: CT CHEST, ABDOMEN, AND PELVIS WITH CONTRAST TECHNIQUE:  Multidetector CT imaging of the chest, abdomen and pelvis was performed following the standard protocol during bolus administration of intravenous contrast. CONTRAST:  OMNIPAQUE IOHEXOL 300 MG/ML  SOLN COMPARISON:  CT scan of May 18, 2015. FINDINGS: CT CHEST FINDINGS Cardiovascular: No significant vascular findings. Normal heart size. No pericardial effusion. Mediastinum/Nodes: Extensive pneumomediastinum is noted which extends into the supraclavicular regions bilaterally. Gas is also seen extending into the pre manubrial soft tissues. Thyroid gland is unremarkable. Lungs/Pleura: No pleural effusion is noted. Minimal pneumothorax is noted in the right major fissure which most likely represents extension of previously described pneumomediastinum. No definite consolidative process is noted in either lung. Musculoskeletal: No chest wall mass or suspicious bone lesions identified. CT ABDOMEN PELVIS FINDINGS Hepatobiliary: No focal liver abnormality is seen. No gallstones, gallbladder wall thickening, or biliary dilatation. Pancreas: Unremarkable. No pancreatic ductal dilatation or surrounding inflammatory changes. Spleen: Normal in size without focal abnormality. Adrenals/Urinary Tract: Adrenal glands and kidneys appear normal. No hydronephrosis or renal obstruction is noted. Moderate diffuse urinary bladder wall thickening is noted which may represent lack of distension, but cystitis cannot be excluded. No renal or ureteral calculi are noted. Stomach/Bowel: The stomach appears normal. There is no evidence of bowel obstruction or inflammation. Status post appendectomy. Vascular/Lymphatic: No significant vascular findings are present. No enlarged abdominal or pelvic lymph nodes. Reproductive: Prostate is unremarkable. Other: No abdominal wall hernia or abnormality. No abdominopelvic ascites. Musculoskeletal: No acute or significant osseous findings. IMPRESSION: Extensive pneumomediastinum is noted which extends  into the supraclavicular regions bilaterally. Gas is also seen extending into the premanubrial soft tissues. Minimal amount of pneumothorax is noted in the right major fissure which most likely represents extension of pneumomediastinum. These findings are concerning for possible tracheal, bronchial or esophageal injury or perforation. Clinical correlation is recommended. Moderate diffuse urinary bladder wall thickening is noted which may represent lack of distension, but cystitis cannot be excluded. Clinical correlation is recommended. No other abnormality seen in the abdomen or pelvis. Electronically Signed   By: Lupita Raider, M.D.   On: 10/26/2018 18:52   Portable Chest 1 View  Result Date: 10/27/2018 CLINICAL DATA:  Initial evaluation for pneumomediastinum.  EXAM: PORTABLE CHEST 1 VIEW COMPARISON:  Prior CT from 10/26/2018. FINDINGS: Cardiac and mediastinal silhouettes are stable in size and contour, and remain within normal limits. Persistent trace pneumomediastinum, most notable at the level of the AP window. Associated soft tissue emphysema present within the lower neck bilaterally overall, degree of pneumomediastinum is likely slightly decreased from previous. Lungs mildly hyperinflated. No focal infiltrates, pulmonary edema, or pleural effusion. No pneumothorax. No acute osseous finding. IMPRESSION: 1. Persistent small volume pneumomediastinum with associated soft tissue emphysema within the lower neck bilaterally. Overall, degree of pneumomediastinum appears somewhat decreased from prior. 2. No other new active cardiopulmonary disease. Electronically Signed   By: Rise Mu M.D.   On: 10/27/2018 05:10   Dg Chest Port 1 View  Result Date: 10/26/2018 CLINICAL DATA:  Shortness of breath. EXAM: PORTABLE CHEST 1 VIEW COMPARISON:  Radiographs of August 06, 2015. FINDINGS: Extensive pneumomediastinum is noted with subcutaneous emphysema is seen in both supraclavicular regions. No definite  pneumothorax is noted. No pleural effusion is noted. No pulmonary opacity is noted. Bony thorax is unremarkable. IMPRESSION: Pneumomediastinum is noted with subcutaneous emphysema seen in both supraclavicular regions. This is concerning for possible esophageal or bronchial rupture or injury. Electronically Signed   By: Lupita Raider, M.D.   On: 10/26/2018 17:39      Procedures:   Subjective: Patient continue psychotic, not cooperative.   Discharge Exam: Vitals:   10/28/18 0609 10/28/18 1259  BP: 108/61 138/73  Pulse: 66 80  Resp: 18 16  Temp: 98.1 F (36.7 C) 97.6 F (36.4 C)  SpO2: 100% 97%   Vitals:   10/27/18 1728 10/27/18 2151 10/28/18 0609 10/28/18 1259  BP: 113/72 119/67 108/61 138/73  Pulse: 83 65 66 80  Resp: Temp: 97.9 F (36.6 C) 98.1 F (36.7 C) 98.1 F (36.7 C) 97.6 F (36.4 C)  TempSrc: Oral Oral Oral Oral  SpO2: 100% 100% 100% 97%    Patient with psychosis, not allowing proper physical examination, no apparent pain or dyspnea. His oxymetry has been 97 to 100% on room air.    The results of significant diagnostics from this hospitalization (including imaging, microbiology, ancillary and laboratory) are listed below for reference.     Microbiology: No results found for this or any previous visit (from the past 240 hour(s)).   Labs: BNP (last 3 results) No results for input(s): BNP in the last 8760 hours. Basic Metabolic Panel: Recent Labs  Lab 10/26/18 1411  NA 139  K 3.1*  CL 104  CO2 21*  GLUCOSE 101*  BUN 12  CREATININE 0.99  CALCIUM 9.1   Liver Function Tests: Recent Labs  Lab 10/26/18 1411  AST 27  ALT 26  ALKPHOS 52  BILITOT 1.2  PROT 7.2  ALBUMIN 4.3   No results for input(s): LIPASE, AMYLASE in the last 168 hours. No results for input(s): AMMONIA in the last 168 hours. CBC: Recent Labs  Lab 10/26/18 1411  WBC 8.1  NEUTROABS 4.8  HGB 13.5  HCT 38.5*  MCV 88.1  PLT 258   Cardiac Enzymes: No results  for input(s): CKTOTAL, CKMB, CKMBINDEX, TROPONINI in the last 168 hours. BNP: Invalid input(s): POCBNP CBG: No results for input(s): GLUCAP in the last 168 hours. D-Dimer No results for input(s): DDIMER in the last 72 hours. Hgb A1c No results for input(s): HGBA1C in the last 72 hours. Lipid Profile No results for input(s): CHOL, HDL, LDLCALC, TRIG, CHOLHDL, LDLDIRECT in the last  72 hours. Thyroid function studies No results for input(s): TSH, T4TOTAL, T3FREE, THYROIDAB in the last 72 hours.  Invalid input(s): FREET3 Anemia work up No results for input(s): VITAMINB12, FOLATE, FERRITIN, TIBC, IRON, RETICCTPCT in the last 72 hours. Urinalysis    Component Value Date/Time   COLORURINE YELLOW 12/12/2015 1013   APPEARANCEUR CLOUDY (A) 12/12/2015 1013   LABSPEC 1.025 12/12/2015 1013   PHURINE 6.0 12/12/2015 1013   GLUCOSEU NEGATIVE 12/12/2015 1013   HGBUR NEGATIVE 12/12/2015 1013   BILIRUBINUR NEGATIVE 12/12/2015 1013   KETONESUR NEGATIVE 12/12/2015 1013   PROTEINUR NEGATIVE 12/12/2015 1013   UROBILINOGEN 0.2 06/24/2012 2013   NITRITE NEGATIVE 12/12/2015 1013   LEUKOCYTESUR SMALL (A) 12/12/2015 1013   Sepsis Labs Invalid input(s): PROCALCITONIN,  WBC,  LACTICIDVEN Microbiology No results found for this or any previous visit (from the past 240 hour(s)).   Time coordinating discharge: 45 minutes  SIGNED:   Coralie Keens, MD  [facility] 10/28/2018, 1:15 PM

## 2018-10-29 ENCOUNTER — Encounter (HOSPITAL_COMMUNITY): Payer: Self-pay

## 2018-10-29 ENCOUNTER — Inpatient Hospital Stay (HOSPITAL_COMMUNITY)
Admission: AD | Admit: 2018-10-29 | Discharge: 2018-11-04 | DRG: 885 | Disposition: A | Discharge status: Home / Self Care | Payer: No Typology Code available for payment source | Attending: Psychiatry | Admitting: Psychiatry

## 2018-10-29 ENCOUNTER — Other Ambulatory Visit: Payer: Self-pay

## 2018-10-29 DIAGNOSIS — F23 Brief psychotic disorder: Secondary | ICD-10-CM

## 2018-10-29 DIAGNOSIS — M545 Low back pain: Secondary | ICD-10-CM | POA: Diagnosis present

## 2018-10-29 DIAGNOSIS — F4310 Post-traumatic stress disorder, unspecified: Secondary | ICD-10-CM | POA: Diagnosis present

## 2018-10-29 DIAGNOSIS — F209 Schizophrenia, unspecified: Secondary | ICD-10-CM | POA: Diagnosis present

## 2018-10-29 DIAGNOSIS — Z590 Homelessness: Secondary | ICD-10-CM

## 2018-10-29 DIAGNOSIS — F201 Disorganized schizophrenia: Secondary | ICD-10-CM | POA: Diagnosis not present

## 2018-10-29 DIAGNOSIS — F29 Unspecified psychosis not due to a substance or known physiological condition: Secondary | ICD-10-CM | POA: Diagnosis present

## 2018-10-29 DIAGNOSIS — J45909 Unspecified asthma, uncomplicated: Secondary | ICD-10-CM | POA: Diagnosis present

## 2018-10-29 DIAGNOSIS — F17210 Nicotine dependence, cigarettes, uncomplicated: Secondary | ICD-10-CM | POA: Diagnosis present

## 2018-10-29 MED ORDER — OLANZAPINE 10 MG PO TBDP
10.0000 mg | ORAL_TABLET | Freq: Three times a day (TID) | ORAL | Status: DC | PRN
  Administered 2018-11-03: 10 mg via ORAL
  Filled 2018-10-29: qty 1

## 2018-10-29 MED ORDER — TRAZODONE HCL 50 MG PO TABS
50.0000 mg | ORAL_TABLET | Freq: Every evening | ORAL | Status: DC | PRN
  Filled 2018-10-29 (×6): qty 1

## 2018-10-29 MED ORDER — LORAZEPAM 1 MG PO TABS: 1.0000 mg | ORAL_TABLET | ORAL | Status: DC | PRN

## 2018-10-29 MED ORDER — OLANZAPINE 2.5 MG PO TABS
2.5000 mg | ORAL_TABLET | Freq: Two times a day (BID) | ORAL | Status: DC
  Filled 2018-10-29 (×5): qty 1

## 2018-10-29 MED ORDER — ZIPRASIDONE MESYLATE 20 MG IM SOLR: 20.0000 mg | INTRAMUSCULAR | Status: DC | PRN

## 2018-10-29 MED ORDER — OLANZAPINE 2.5 MG PO TABS
2.5000 mg | ORAL_TABLET | Freq: Two times a day (BID) | ORAL | Status: DC
  Filled 2018-10-29: qty 1

## 2018-10-29 MED ORDER — OLANZAPINE 10 MG IM SOLR
5.0000 mg | Freq: Two times a day (BID) | INTRAMUSCULAR | Status: DC | PRN
  Filled 2018-10-29: qty 10

## 2018-10-29 NOTE — Progress Notes (Addendum)
Patient has been discharged from the medical ward, currently waiting for transfer to inpatient psychiatry.  He has been started on olanzapine with good toleration, will decrease dose to 2.5 mg to avoid oversedation.  No apparent dyspnea, oxygenating well. Positive bilateral breath sounds.

## 2018-10-29 NOTE — Progress Notes (Signed)
[place] NOVEL CORONAVIRUS (COVID-19) DAILY CHECK-OFF SYMPTOMS - answer yes or no to each - every day NO YES  Have you had a fever in the past 24 hours?  . Fever (Temp > 37.80C / 100F) X   Have you had any of these symptoms in the past 24 hours? . New Cough .  Sore Throat  .  Shortness of Breath .  Difficulty Breathing .  Unexplained Body Aches   X   Have you had any one of these symptoms in the past 24 hours not related to allergies?   . Runny Nose .  Nasal Congestion .  Sneezing   X   If you have had runny nose, nasal congestion, sneezing in the past 24 hours, has it worsened?  X   EXPOSURES - check yes or no X   Have you traveled outside the state in the past 14 days?  X   Have you been in contact with someone with a confirmed diagnosis of COVID-19 or PUI in the past 14 days without wearing appropriate PPE?  X   Have you been living in the same home as a person with confirmed diagnosis of COVID-19 or a PUI (household contact)?    X   Have you been diagnosed with COVID-19?    X              What to do next: Answered NO to all: Answered YES to anything:   Proceed with unit schedule Follow the [facility] Inpatient Flowsheet.   

## 2018-10-29 NOTE — Progress Notes (Signed)
Patient has discharged to [facility] on 10/29/2018. RN called for report and no question at this time. Patient will be transported by police as patient was IVC

## 2018-10-29 NOTE — Progress Notes (Signed)
   10/29/18 0508  MEWS Score  Resp 17  Pulse Rate (!) 47  BP (!) 107/57  Temp 98 F (36.7 C)  SpO2 100 %  O2 Device Room Air  MEWS Score  MEWS RR 0  MEWS Pulse 1  MEWS Systolic 0  MEWS LOC 1  MEWS Temp 0  MEWS Score 2  MEWS Score Color Yellow  MEWS Assessment  Is this an acute change? Yes  MEWS guidelines implemented *See Row Information* Yellow   MEWS Guidelines - (patients age 22 and over)  Red - At High Risk for Deterioration Yellow - At risk for Deterioration  1. Go to room and assess patient 2. Validate data. Is this patient's baseline? If data confirmed: 3. Is this an acute change? 4. Administer prn meds/treatments as ordered. 5. Note Sepsis score 6. Review goals of care 7. Radiation protection practitioner, RRT nurse and Provider. 8. Ask Provider to come to bedside.  9. Document patient condition/interventions/response. 10. Increase frequency of vital signs and focused assessments to at least q15 minutes x 4, then q30 minutes x2. - If stable, then q1h x3, then q4h x3 and then q8h or dept. routine. - If unstable, contact Provider & RRT nurse. Prepare for possible transfer. 11. Add entry in progress notes using the smart phrase ".MEWS". 1. Go to room and assess patient 2. Validate data. Is this patient's baseline? If data confirmed: 3. Is this an acute change? 4. Administer prn meds/treatments as ordered? 5. Note Sepsis score 6. Review goals of care 7. Radiation protection practitioner and Provider 8. Call RRT nurse as needed. 9. Document patient condition/interventions/response. 10. Increase frequency of vital signs and focused assessments to at least q2h x2. - If stable, then q4h x2 and then q8h or dept. routine. - If unstable, contact Provider & RRT nurse. Prepare for possible transfer. 11. Add entry in progress notes using the smart phrase ".MEWS".  Green - Likely stable Lavender - Comfort Care Only  1. Continue routine/ordered monitoring.  2. Review goals of care. 1. Continue  routine/ordered monitoring. 2. Review goals of care.   Radial pulse 49, patient sleeping most of shift. Ativan 1mg  IM administered  1333, Haldol 1mg  IM administered 1659. Zyprexa 5mg  by mouth administered at 2141. Patient has slept entire shift. Responding  to touch. Sitter remains at bedside for safety. Will continue to monitor.

## 2018-10-29 NOTE — Tx Team (Signed)
Initial Treatment Plan 10/29/2018 10:33 PM Dalton Rosario RRN:[id number]    PATIENT STRESSORS: Financial difficulties Medication change or noncompliance Other: Homeless    PATIENT STRENGTHS: Active sense of humor Physical Health Special hobby/interest   PATIENT IDENTIFIED PROBLEMS: "Psychosis"   "Homeless"                   DISCHARGE CRITERIA:  Ability to meet basic life and health needs Improved stabilization in mood, thinking, and/or behavior Safe-care adequate arrangements made Verbal commitment to aftercare and medication compliance  PRELIMINARY DISCHARGE PLAN: Attend PHP/IOP Outpatient therapy Placement in alternative living arrangements  PATIENT/FAMILY INVOLVEMENT: This treatment plan has been presented to and reviewed with the patient, Dalton Rosario. The patient have been given the opportunity to ask questions and make suggestions.  Tyrone Apple, RN 10/29/2018, 10:33 PM

## 2018-10-29 NOTE — Progress Notes (Signed)
   10/29/18 0654  Vitals  Temp 97.6 F (36.4 C)  Temp Source Oral  BP (!) 85/51  MAP (mmHg) (!) 62  BP Location Left Arm  BP Method Automatic  Patient Position (if appropriate) Lying  Pulse Rate (!) 48  Resp 16  Oxygen Therapy  SpO2 100 %  O2 Device Room Air     Rapid response nurse notified, at bedside at this time.   Patient sitting up in chair, alert, oriented to person. BP at 0713 BP 112/73. Will continue to monitor.

## 2018-10-29 NOTE — TOC Initial Note (Signed)
Transition of Care [facility]) - Initial/Assessment Note    Patient Details  Name: Dalton Rosario MRN: [id number] Date of Birth: 03/16/1996  Transition of Care [facility]) CM/SW Contact:    Coralyn Helling, LCSW Phone Number: 10/29/2018, 11:40 AM  Clinical Narrative:  Patient is IVC'd and will dc to inpatient psych. Patient under review at [facility]. According to attending patient is medically stable to transport.                   Expected Discharge Plan: Psychiatric Hospital Barriers to Discharge: Psych Bed not available   Patient Goals and CMS Choice     Choice offered to / list presented to : NA  Expected Discharge Plan and Services Expected Discharge Plan: Psychiatric Hospital In-house Referral: NA Discharge Planning Services: NA Post Acute Care Choice: NA Living arrangements for the past 2 months: Homeless Expected Discharge Date: 10/29/18                 DME Agency: NA HH Arranged: NA HH Agency: NA  Prior Living Arrangements/Services Living arrangements for the past 2 months: Homeless Lives with:: Self Patient language and need for interpreter reviewed:: No        Need for Family Participation in Patient Care: Yes (Comment) Care giver support system in place?: Yes (comment)   Criminal Activity/Legal Involvement Pertinent to Current Situation/Hospitalization: No - Comment as needed  Activities of Daily Living Home Assistive Devices/Equipment: None ADL Screening (condition at time of admission) Patient's cognitive ability adequate to safely complete daily activities?: No Is the patient deaf or have difficulty hearing?: No Does the patient have difficulty seeing, even when wearing glasses/contacts?: No Does the patient have difficulty concentrating, remembering, or making decisions?: Yes Patient able to express need for assistance with ADLs?: Yes Does the patient have difficulty dressing or bathing?: No Independently performs ADLs?: Yes (appropriate for developmental  age) Does the patient have difficulty walking or climbing stairs?: No Weakness of Legs: None Weakness of Arms/Hands: None  Permission Sought/Granted Permission sought to share information with : Facility Contact Representative(Patient IVC'd) Permission granted to share information with : No              Emotional Assessment Appearance:: Appears stated age Attitude/Demeanor/Rapport: Lethargic Affect (typically observed): Calm Orientation: : Oriented to Self, Oriented to Place, Oriented to  Time, Oriented to Situation Alcohol / Substance Use: Illicit Drugs Psych Involvement: No (comment)  Admission diagnosis:  Pneumomediastinum (HCC) [J98.2] Medical clearance for psychiatric admission [Z00.8] Patient Active Problem List   Diagnosis Date Noted  . Acute psychosis (HCC)   . Pneumomediastinum (HCC) 10/26/2018  . Toxic encephalopathy 10/26/2018  . Hypokalemia 10/26/2018  . MDD (major depressive disorder), recurrent episode, moderate (HCC) 06/20/2012  . PTSD (post-traumatic stress disorder) 06/20/2012  . ADHD (attention deficit hyperactivity disorder), combined type 06/20/2012   PCP:  Rehabilitation, [facility] Physical Medicine & Pharmacy:  No Pharmacies Listed    Social Determinants of Health (SDOH) Interventions    Readmission Risk Interventions Readmission Risk Prevention Plan 10/29/2018 10/28/2018  Post Dischage Appt - Complete  Medication Screening - Complete  Transportation Screening Complete -  Some recent data might be hidden

## 2018-10-29 NOTE — Progress Notes (Signed)
Admission Note:    Dalton Rosario (prefers to be call [name]) is a 23 y.o male admitted to [facility] for psychosis. Pt was agitated/irritable/argumentative/bizarre on arrival. Pt refuses to answer admission questions at this time despite multiple efforts. Pt refused to sign paperwork and EKG. Pt refused to put on new wristband. Pt states "I am not behavioral; You're taking up all my air". Pt appears delusional and making bizarre comments to Clinical research associate. Pt appears to be belligerent with darting eye contact. Skin was assessed and found to be clear of any abnormal marks. Pt searched and no contraband found, POC and unit policies explained and understanding verbalized. Food and fluids offered, Pt declined. Pt escorted back to unit with security present. Provider on call made aware and new orders obtained; See MAR. With encouragement, Pt refused scheduled HS meds. Currently, Pt is singing loudly in his room. Pt requires redirection from time. Will continue with POC.

## 2018-10-29 NOTE — Progress Notes (Signed)
Called to patient bedside due to lethargy and low BP. Pt was given medications due to agitation in the night and was having a hard time waking with a systolic pressure in the 80s. Upon arrival to bedside, pt is alert and oriented x4 with a BP of 112/73. No complaints other than "I'm ready for breakfast and want some water" Pt educated on side effects of medications given and RN advised to monitor. No acute distress upon exiting the department.

## 2018-10-29 NOTE — Progress Notes (Signed)
Pt escorted to [facility] by police officers at 2056. Pt was alert, oriented to self and speaking irrationally. Pt stable. AVS and IVC paperwork given to officers.

## 2018-10-30 DIAGNOSIS — F201 Disorganized schizophrenia: Secondary | ICD-10-CM

## 2018-10-30 MED ORDER — HALOPERIDOL LACTATE 2 MG/ML PO CONC
10.0000 mg | Freq: Two times a day (BID) | ORAL | Status: DC
  Administered 2018-10-31: 10 mg via ORAL
  Filled 2018-10-30 (×5): qty 5

## 2018-10-30 NOTE — Plan of Care (Signed)
Patient is seen sitting in the day room not interacting with others. Denies SI HI AVH. Continues to be irritable and sarcastic towards staff, telling the other nurse on the hallway he is "smarter than him." Consistently refusing medication. Denies any psychosis or hallucinations. Says he does not need to be here. MD is notified of medication noncompliance. Possibly considering a forced med order. Safety is maintained with 15 minute checks as well as environmental checks.  Problem: Education: Goal: Knowledge of the prescribed therapeutic regimen will improve Outcome: Not Progressing   Problem: Activity: Goal: Interest or engagement in leisure activities will improve Outcome: Not Progressing Goal: Imbalance in normal sleep/wake cycle will improve Outcome: Not Progressing   Problem: Coping: Goal: Coping ability will improve Outcome: Not Progressing Goal: Will verbalize feelings Outcome: Not Progressing

## 2018-10-30 NOTE — H&P (Signed)
Psychiatric Admission Assessment Adult  Patient Identification: Dalton Rosario MRN:  [id number] Date of Evaluation:  10/30/2018 Chief Complaint:  Acute Psychosis Principal Diagnosis: Chronic undertreated psychotic disorder/acute exacerbation/substance abuse/recent trauma Diagnosis:  Active Problems:   Psychosis (HCC)  History of Present Illness:  Mr. Dalton Rosario is 23 years of age he has been diagnosed with schizophrenia according to information from his foster mother, he is homeless chronically as well.  He presented through the emergency department on 4/11 after some type of trauma that was never fully recalled elaborated by the patient but he had a bloody nose and he also had a pneumomediastinum.  He was found by a passerby and had been just noted to be sitting on the sidewalk behaving bizarrely  He was poorly cooperative in the emergency department further he was poorly cooperative on the medical floor at times attempting to leave, and known to be psychotic throughout the time he had been in this healthcare system since 4/11, further complicated by refusal of medications.  His interview is informative only and that it reflects his psychotic state he simply echoes the questions in a sarcastic fashion and jokingly states "now you are confused" so he is conversant but making no sense at all his drug screen showed benzodiazepines and cannabis.  He would not answer with aggressive substance abuse in the controlled substance databank shows no prescriptions for him at all certainly none for benzodiazepines but no scheduled prescriptions have been written over the last 3 years  So currently he is alert and oriented to his general situation we presume and again giving mocking answer simply repeating the questions but becomes more conversant with fellow patients but simply not making much sense to them either.  Associated Signs/Symptoms: Depression Symptoms:  ukn (Hypo) Manic Symptoms:   Distractibility, Impulsivity, Irritable Mood, Anxiety Symptoms:  ukn Psychotic Symptoms:  Delusions, PTSD Symptoms: Carries this diagnosis see below but specifics are unknown Total Time spent with patient: 45 minutes  Past Psychiatric History: At age 23 required inpatient treatment for depression and suicidality was also diagnosed with PTSD  Is the patient at risk to self? Yes.    Has the patient been a risk to self in the past 6 months? No.  Has the patient been a risk to self within the distant past? Yes.    Is the patient a risk to others? No.  Has the patient been a risk to others in the past 6 months? No.  Has the patient been a risk to others within the distant past? No.   Alcohol Screening: 1. How often do you have a drink containing alcohol?: Never 2. How many drinks containing alcohol do you have on a typical day when you are drinking?: 1 or 2 3. How often do you have six or more drinks on one occasion?: Never AUDIT-C Score: 0 4. How often during the last year have you found that you were not able to stop drinking once you had started?: Never 5. How often during the last year have you failed to do what was normally expected from you becasue of drinking?: Never 6. How often during the last year have you needed a first drink in the morning to get yourself going after a heavy drinking session?: Never 7. How often during the last year have you had a feeling of guilt of remorse after drinking?: Never 9. Have you or someone else been injured as a result of your drinking?: No 10. Has a relative or friend or a  doctor or another health worker been concerned about your drinking or suggested you cut down?: No Alcohol Use Disorder Identification Test Final Score (AUDIT): 0 Substance Abuse History in the last 12 months:  Yes.   Consequences of Substance Abuse: NA Previous Psychotropic Medications: Yes  Psychological Evaluations: No  Past Medical History:  Past Medical History:   Diagnosis Date  . ADHD (attention deficit hyperactivity disorder)   . Anxiety   . Asthma   . Depression     Past Surgical History:  Procedure Laterality Date  . APPENDECTOMY     Pt was 23 yo   Family History: History reviewed. No pertinent family history. Family Psychiatric  History: ukn Tobacco Screening: Have you used any form of tobacco in the last 30 days? (Cigarettes, Smokeless Tobacco, Cigars, and/or Pipes): Patient Refused Screening Social History:  Social History   Substance and Sexual Activity  Alcohol Use No     Social History   Substance and Sexual Activity  Drug Use No    Additional Social History:                           Allergies:  No Known Allergies Lab Results: No results found for this or any previous visit (from the past 48 hour(s)).  Blood Alcohol level:  Lab Results  Component Value Date   [facility] <10 10/26/2018   ETH <11 06/19/2012    Metabolic Disorder Labs:  Lab Results  Component Value Date   HGBA1C 5.4 06/21/2012   MPG 108 06/21/2012   No results found for: PROLACTIN Lab Results  Component Value Date   CHOL 201 (H) 06/21/2012   TRIG 207 (H) 06/21/2012   HDL 42 06/21/2012   CHOLHDL 4.8 06/21/2012   VLDL 41 (H) 06/21/2012   LDLCALC 118 (H) 06/21/2012    Current Medications: Current Facility-Administered Medications  Medication Dose Route Frequency Provider Last Rate Last Dose  . haloperidol (HALDOL) 2 MG/ML solution 10 mg  10 mg Oral BID Malvin Johns, MD      . OLANZapine zydis (ZYPREXA) disintegrating tablet 10 mg  10 mg Oral Q8H PRN Kerry Hough, PA-C       And  . LORazepam (ATIVAN) tablet 1 mg  1 mg Oral PRN Donell Sievert E, PA-C       And  . ziprasidone (GEODON) injection 20 mg  20 mg Intramuscular PRN Kerry Hough, PA-C      . traZODone (DESYREL) tablet 50 mg  50 mg Oral QHS,MR X 1 Simon, Spencer E, PA-C       PTA Medications: No medications prior to admission.    Musculoskeletal: Strength & Muscle  Tone: within normal limits Gait & Station: normal Patient leans: N/A  Psychiatric Specialty Exam: Physical Exam  ROS  Blood pressure (!) 136/92, pulse (!) 53, temperature 98.1 F (36.7 C), resp. rate 18, height  (1.702 m), weight 93 kg.Body mass index is 32.11 kg/m.  General Appearance: Casual and Disheveled  Eye Contact:  Fair  Speech:  Pressured  Volume:  Normal  Mood:  Irritable and Labile  Affect:  Non-Congruent  Thought Process:  Disorganized and Irrelevant  Orientation:  Other:  Person and presumed to situation will not answer specifically  Thought Content:  Illogical  Suicidal Thoughts:  No  Homicidal Thoughts:  No  Memory:  Immediate;   Poor  Judgement:  Poor  Insight:  Lacking  Psychomotor Activity:  Decreased  Concentration:  Attention  Span: Poor  Recall:  Poor  Fund of Knowledge:  Poor  Language:  Poor  Akathisia:  Negative  Handed:  Right  AIMS (if indicated):     Assets:  Leisure Time  ADL's:  Intact  Cognition:  WNL  Sleep:  Number of Hours: 4.5    Treatment Plan Summary: Daily contact with patient to assess and evaluate symptoms and progress in treatment, Medication management and Plan Reality based therapy begin Haldol  Observation Level/Precautions:  15 minute checks  Laboratory:  UDS  Psychotherapy:  Reality based  Medications:  haldol  Consultations:  n/a  Discharge Concerns:  Safety-compliance-diagnostic clarity  Estimated LOS:10d  Other:       Physician Treatment Plan for Primary Diagnosis: <principal problem not specified> Long Term Goal(s): Improvement in symptoms so as ready for discharge  Short Term Goals: Ability to maintain clinical measurements within normal limits will improve and Compliance with prescribed medications will improve  Physician Treatment Plan for Secondary Diagnosis: Active Problems:   Psychosis (HCC)  Long Term Goal(s): Improvement in symptoms so as ready for discharge  Short Term Goals: Ability to  identify and develop effective coping behaviors will improve and Ability to maintain clinical measurements within normal limits will improve  I certify that inpatient services furnished can reasonably be expected to improve the patient's condition.    Malvin Johns, MD 4/15/202010:59 AM

## 2018-10-30 NOTE — BHH Suicide Risk Assessment (Signed)
[facility] Admission Suicide Risk Assessment   Nursing information obtained from:    Demographic factors:  Male, Low socioeconomic status, Unemployed, Adolescent or young adult Current Mental Status:  NA Loss Factors:  Financial problems / change in socioeconomic status Historical Factors:  Impulsivity Risk Reduction Factors:  NA  Total Time spent with patient: 45 minutes Principal Problem: Chronic undertreated psychotic disorder Diagnosis:  Active Problems:   Psychosis (HCC)  Subjective Data: Patient transferred from medicine had presented through the emergency department after some type of trauma and remains in a psychotic state  Continued Clinical Symptoms:  Alcohol Use Disorder Identification Test Final Score (AUDIT): 0 The "Alcohol Use Disorders Identification Test", Guidelines for Use in Primary Care, Second Edition.  World Science writer [facility]). Score between 0-7:  no or low risk or alcohol related problems. Score between 8-15:  moderate risk of alcohol related problems. Score between 16-19:  high risk of alcohol related problems. Score 20 or above:  warrants further diagnostic evaluation for alcohol dependence and treatment.   CLINICAL FACTORS:   Alcohol/Substance Abuse/Dependencies    COGNITIVE FEATURES THAT CONTRIBUTE TO RISK:  Loss of executive function    SUICIDE RISK:   Minimal: No identifiable suicidal ideation.  Patients presenting with no risk factors but with morbid ruminations; may be classified as minimal risk based on the severity of the depressive symptoms  PLAN OF CARE: Encourage compliance seek diagnostic clarity monitor for withdrawal  I certify that inpatient services furnished can reasonably be expected to improve the patient's condition.   Malvin Johns, MD 10/30/2018, 10:57 AM

## 2018-10-30 NOTE — BHH Group Notes (Signed)
B[facility] Group Notes:  (Nursing/MHT/Case Management/Adjunct)  Date:  10/30/2018  Time:  4:00 PM  Type of Therapy:  Nurse Education  Participation Level:  None  Participation Quality:  Intrusive, Inattentive, Monopolizing and Resistant  Affect:  Anxious, Defensive and Resistant  Cognitive:  Disorganized, Confused, Delusional and Lacking  Insight:  Lacking and Limited  Engagement in Group:  Lacking, Limited, Monopolizing, Off Topic, Poor and Resistant  Modes of Intervention:  Limit-setting and Reality Testing  Summary of Progress/Problems: pt's discussed discharge planning and how to utilize resources when needed.  Suszanne Conners [place] 10/30/2018, 4:36 PM

## 2018-10-30 NOTE — Tx Team (Addendum)
Interdisciplinary Treatment and Diagnostic Plan Update  10/30/2018 Time of Session: [place] MRN: [id number]  Principal Diagnosis: <principal problem not specified>  Secondary Diagnoses: Active Problems:   Psychosis ([place])   Current Medications:  Current Facility-Administered Medications  Medication Dose Route Frequency Provider Last Rate Last Dose  . haloperidol (HALDOL) 2 MG/ML solution 10 mg  10 mg Oral BID Johnn Hai, MD      . OLANZapine zydis (ZYPREXA) disintegrating tablet 10 mg  10 mg Oral Q8H PRN Laverle Hobby, PA-C       And  . LORazepam (ATIVAN) tablet 1 mg  1 mg Oral PRN Patriciaann Clan E, PA-C       And  . ziprasidone (GEODON) injection 20 mg  20 mg Intramuscular PRN Laverle Hobby, PA-C      . traZODone (DESYREL) tablet 50 mg  50 mg Oral QHS,MR X 1 Simon, Spencer E, PA-C       PTA Medications: No medications prior to admission.    Patient Stressors: Financial difficulties Medication change or noncompliance Other: Homeless   Patient Strengths: Active sense of humor Physical Health Special hobby/interest  Treatment Modalities: Medication Management, Group therapy, Case management,  1 to 1 session with clinician, Psychoeducation, Recreational therapy.   Physician Treatment Plan for Primary Diagnosis: <principal problem not specified> Long Term Goal(s): Improvement in symptoms so as ready for discharge Improvement in symptoms so as ready for discharge   Short Term Goals: Ability to maintain clinical measurements within normal limits will improve Compliance with prescribed medications will improve Ability to identify and develop effective coping behaviors will improve Ability to maintain clinical measurements within normal limits will improve  Medication Management: Evaluate patient's response, side effects, and tolerance of medication regimen.  Therapeutic Interventions: 1 to 1 sessions, Unit Group sessions and Medication  administration.  Evaluation of Outcomes: Not Met  Physician Treatment Plan for Secondary Diagnosis: Active Problems:   Psychosis ([place])  Long Term Goal(s): Improvement in symptoms so as ready for discharge Improvement in symptoms so as ready for discharge   Short Term Goals: Ability to maintain clinical measurements within normal limits will improve Compliance with prescribed medications will improve Ability to identify and develop effective coping behaviors will improve Ability to maintain clinical measurements within normal limits will improve     Medication Management: Evaluate patient's response, side effects, and tolerance of medication regimen.  Therapeutic Interventions: 1 to 1 sessions, Unit Group sessions and Medication administration.  Evaluation of Outcomes: Not Met   RN Treatment Plan for Primary Diagnosis: <principal problem not specified> Long Term Goal(s): Knowledge of disease and therapeutic regimen to maintain health will improve  Short Term Goals: Ability to identify and develop effective coping behaviors will improve and Compliance with prescribed medications will improve  Medication Management: RN will administer medications as ordered by provider, will assess and evaluate patient's response and provide education to patient for prescribed medication. RN will report any adverse and/or side effects to prescribing provider.  Therapeutic Interventions: 1 on 1 counseling sessions, Psychoeducation, Medication administration, Evaluate responses to treatment, Monitor vital signs and CBGs as ordered, Perform/monitor CIWA, COWS, AIMS and Fall Risk screenings as ordered, Perform wound care treatments as ordered.  Evaluation of Outcomes: Not Met   LCSW Treatment Plan for Primary Diagnosis: <principal problem not specified> Long Term Goal(s): Safe transition to appropriate next level of care at discharge, Engage patient in therapeutic group addressing interpersonal  concerns.  Short Term Goals: Engage patient in aftercare planning with  referrals and resources, Increase social support and Increase skills for wellness and recovery  Therapeutic Interventions: Assess for all discharge needs, 1 to 1 time with Social worker, Explore available resources and support systems, Assess for adequacy in community support network, Educate family and significant other(s) on suicide prevention, Complete Psychosocial Assessment, Interpersonal group therapy.  Evaluation of Outcomes: Not Met   Progress in Treatment: Attending groups: No. Participating in groups: No. Taking medication as prescribed: No. Toleration medication: No. Family/Significant other contact made: No, will contact:  when given permission Patient understands diagnosis: No. Discussing patient identified problems/goals with staff: No. Medical problems stabilized or resolved: Yes. Denies suicidal/homicidal ideation: Yes. Issues/concerns per patient self-inventory: No. Other: none  New problem(s) identified: No, Describe:  none  New Short Term/Long Term Goal(s):  Patient Goals:  "to breathe"  Discharge Plan or Barriers:   Reason for Continuation of Hospitalization: Hallucinations Medication stabilization  Estimated Length of Stay:3-5 days.  Attendees: Patient:Dalton Rosario 10/30/2018   Physician: Dr. Jake Samples, MD 10/30/2018   Nursing: Neldon Newport, RN 10/30/2018   RN Care Manager: 10/30/2018   Social Worker: Lurline Idol, LCSW 10/30/2018   Recreational Therapist:  10/30/2018   Other:  10/30/2018   Other:  10/30/2018   Other: 10/30/2018     Scribe for Treatment Team: Joanne Chars, [place] 10/30/2018 3:25 PM

## 2018-10-30 NOTE — BHH Group Notes (Signed)
  BHH LCSW Group Therapy Note  Date/Time: 10/30/18, 1315  Type of Therapy/Topic:  Group Therapy:  Emotion Regulation  Participation Level:  Did Not Attend   Mood:  Description of Group:    The purpose of this group is to assist patients in learning to regulate negative emotions and experience positive emotions. Patients will be guided to discuss ways in which they have been vulnerable to their negative emotions. These vulnerabilities will be juxtaposed with experiences of positive emotions or situations, and patients challenged to use positive emotions to combat negative ones. Special emphasis will be placed on coping with negative emotions in conflict situations, and patients will process healthy conflict resolution skills.  Therapeutic Goals: 1. Patient will identify two positive emotions or experiences to reflect on in order to balance out negative emotions:  2. Patient will label two or more emotions that they find the most difficult to experience:  3. Patient will be able to demonstrate positive conflict resolution skills through discussion or role plays:   Summary of Patient Progress:       Therapeutic Modalities:   Cognitive Behavioral Therapy Feelings Identification Dialectical Behavioral Therapy  Greg Robbie Rideaux, LCSW 

## 2018-10-30 NOTE — BHH Counselor (Signed)
CSW attempted to complete PSA with pt.  Pt being uncooperative and giving smart alec answers.  Will try again later. Garner Nash, MSW, LCSW Clinical Social Worker 10/30/2018 11:13 AM

## 2018-10-30 NOTE — Progress Notes (Signed)
Pt making bizarre and random comments; and observe laughing to himself.

## 2018-10-31 MED ORDER — HALOPERIDOL LACTATE 2 MG/ML PO CONC
10.0000 mg | Freq: Every day | ORAL | Status: DC
  Administered 2018-11-02 – 2018-11-04 (×3): 10 mg via ORAL
  Filled 2018-10-31 (×6): qty 5

## 2018-10-31 MED ORDER — TRAZODONE HCL 100 MG PO TABS
200.0000 mg | ORAL_TABLET | Freq: Every evening | ORAL | Status: DC | PRN
  Administered 2018-10-31 – 2018-11-03 (×4): 200 mg via ORAL
  Filled 2018-10-31 (×12): qty 2

## 2018-10-31 MED ORDER — BENZTROPINE MESYLATE 1 MG PO TABS
1.0000 mg | ORAL_TABLET | Freq: Two times a day (BID) | ORAL | Status: DC
  Administered 2018-11-01 – 2018-11-04 (×5): 1 mg via ORAL
  Filled 2018-10-31 (×15): qty 1

## 2018-10-31 MED ORDER — HALOPERIDOL LACTATE 2 MG/ML PO CONC
15.0000 mg | Freq: Every day | ORAL | Status: DC
  Administered 2018-10-31 – 2018-11-03 (×4): 15 mg via ORAL
  Filled 2018-10-31 (×6): qty 7.5

## 2018-10-31 NOTE — [facility] Group Notes (Signed)
[facility] LCSW Group Therapy Note  Date/Time: 10/31/18, 1315  Type of Therapy/Topic:  Group Therapy:  Balance in Life  Participation Level:  Did not attend  Description of Group:    This group will address the concept of balance and how it feels and looks when one is unbalanced. Patients will be encouraged to process areas in their lives that are out of balance, and identify reasons for remaining unbalanced. Facilitators will guide patients utilizing problem- solving interventions to address and correct the stressor making their life unbalanced. Understanding and applying boundaries will be explored and addressed for obtaining  and maintaining a balanced life. Patients will be encouraged to explore ways to assertively make their unbalanced needs known to significant others in their lives, using other group members and facilitator for support and feedback.  Therapeutic Goals: 1. Patient will identify two or more emotions or situations they have that consume much of in their lives. 2. Patient will identify signs/triggers that life has become out of balance:  3. Patient will identify two ways to set boundaries in order to achieve balance in their lives:  4. Patient will demonstrate ability to communicate their needs through discussion and/or role plays  Summary of Patient Progress:          Therapeutic Modalities:   Cognitive Behavioral Therapy Solution-Focused Therapy Assertiveness Training  Daleen Squibb, LCSW

## 2018-10-31 NOTE — Progress Notes (Signed)
[facility] MD Progress Note  10/31/2018 10:14 AM Dalton Rosario  MRN:  [id number] Subjective:    Patient remains intrusive bizarre making short disjointed statements and seems to attempt to aggravate staff on purpose Getting in the space of staff and other patients despite being told about social distancing.  Was given the ultimatum of take the medication or receive a shot and he did take the liquid Haldol for the first time Principal Problem: Psychotic disorder Diagnosis: Active Problems:   Psychosis (HCC)  Total Time spent with patient: 20 minutes  Past Medical History:  Past Medical History:  Diagnosis Date  . ADHD (attention deficit hyperactivity disorder)   . Anxiety   . Asthma   . Depression     Past Surgical History:  Procedure Laterality Date  . APPENDECTOMY     Pt was 23 yo   Family History: History reviewed. No pertinent family history.  Social History:  Social History   Substance and Sexual Activity  Alcohol Use No     Social History   Substance and Sexual Activity  Drug Use No    Social History   Socioeconomic History  . Marital status: Single    Spouse name: Not on file  . Number of children: Not on file  . Years of education: Not on file  . Highest education level: Not on file  Occupational History  . Not on file  Social Needs  . Financial resource strain: Not on file  . Food insecurity:    Worry: Not on file    Inability: Not on file  . Transportation needs:    Medical: Not on file    Non-medical: Not on file  Tobacco Use  . Smoking status: Current Every Day Smoker    Packs/day: 0.50    Types: Cigarettes  . Smokeless tobacco: Never Used  Substance and Sexual Activity  . Alcohol use: No  . Drug use: No  . Sexual activity: Never  Lifestyle  . Physical activity:    Days per week: Not on file    Minutes per session: Not on file  . Stress: Not on file  Relationships  . Social connections:    Talks on phone: Not on file    Gets together:  Not on file    Attends religious service: Not on file    Active member of club or organization: Not on file    Attends meetings of clubs or organizations: Not on file    Relationship status: Not on file  Other Topics Concern  . Not on file  Social History Narrative  . Not on file   Additional Social History:                         Sleep: Good  Appetite:  Good  Current Medications: Current Facility-Administered Medications  Medication Dose Route Frequency Provider Last Rate Last Dose  . benztropine (COGENTIN) tablet 1 mg  1 mg Oral BID Malvin Johns, MD      . Melene Muller ON 11/01/2018] haloperidol (HALDOL) 2 MG/ML solution 10 mg  10 mg Oral Daily Malvin Johns, MD      . haloperidol (HALDOL) 2 MG/ML solution 15 mg  15 mg Oral QHS Malvin Johns, MD      . OLANZapine zydis (ZYPREXA) disintegrating tablet 10 mg  10 mg Oral Q8H PRN Kerry Hough, PA-C       And  . LORazepam (ATIVAN) tablet 1 mg  1 mg Oral  PRN Kerry HoughSimon, Spencer E, PA-C       And  . ziprasidone (GEODON) injection 20 mg  20 mg Intramuscular PRN Kerry HoughSimon, Spencer E, PA-C      . traZODone (DESYREL) tablet 200 mg  200 mg Oral QHS,MR X 1 Malvin JohnsFarah, Jaice Digioia, MD        Lab Results: No results found for this or any previous visit (from the past 48 hour(s)).  Blood Alcohol level:  Lab Results  Component Value Date   [facility] 10/26/2018   ETH <11 06/19/2012    Metabolic Disorder Labs: Lab Results  Component Value Date   HGBA1C 5.4 06/21/2012   MPG 108 06/21/2012   No results found for: PROLACTIN Lab Results  Component Value Date   CHOL 201 (H) 06/21/2012   TRIG 207 (H) 06/21/2012   HDL 42 06/21/2012   CHOLHDL 4.8 06/21/2012   VLDL 41 (H) 06/21/2012   LDLCALC 118 (H) 06/21/2012    Physical Findings: AIMS: Facial and Oral Movements Muscles of Facial Expression: None, normal Lips and Perioral Area: None, normal Jaw: None, normal Tongue: None, normal,Extremity Movements Upper (arms, wrists, hands, fingers): None,  normal Lower (legs, knees, ankles, toes): None, normal, Trunk Movements Neck, shoulders, hips: None, normal, Overall Severity Severity of abnormal movements (highest score from questions above): None, normal Incapacitation due to abnormal movements: None, normal Patient's awareness of abnormal movements (rate only patient's report): No Awareness, Dental Status Current problems with teeth and/or dentures?: No Does patient usually wear dentures?: No  CIWA:    COWS:     Musculoskeletal: Strength & Muscle Tone: within normal limits Gait & Station: normal Patient leans: N/A  Psychiatric Specialty Exam: Physical Exam  ROS  Blood pressure (!) 136/92, pulse (!) 53, temperature 98.1 F (36.7 C), resp. rate 18, height 5\' 7"  (1.702 m), weight 93 kg.Body mass index is 32.11 kg/m.  General Appearance: Disheveled  Eye Contact:  Minimal  Speech:  Pressured  Volume:  Increased  Mood:  Irritable and labile  Affect:  Non-Congruent  Thought Process:  Disorganized  Orientation:  Other:  person place  Thought Content:  Illogical and Delusions  Suicidal Thoughts:  No  Homicidal Thoughts:  No  Memory:  Immediate;   Poor  Judgement:  Impaired  Insight:  Lacking  Psychomotor Activity:  Restlessness  Concentration:  Attention Span: Poor  Recall:  Poor  Fund of Knowledge:  Poor  Language:  Poor  Akathisia:  Negative  Handed:  Right  AIMS (if indicated):     Assets:  Resilience Social Support  ADL's:  Intact  Cognition:  WNL  Sleep:  Number of Hours: 6.75     Treatment Plan Summary: Daily contact with patient to assess and evaluate symptoms and progress in treatment, Medication management and Plan Escalate Haldol to 10 in the morning 15 at night liquid form in anticipation of long-acting injectable continue reality-based therapy and boundary setting  Kemiyah Tarazon, MD 10/31/2018, 10:14 AM

## 2018-10-31 NOTE — [facility] Group Notes (Signed)
[facility] Group Notes:  (Nursing/MHT/Case Management/Adjunct)  Date:  10/31/2018  Time:  4:27 PM  Type of Therapy:  Psychoeducational Skills  Participation Level:  Did Not Attend    [name] Dalton Rosario Dalton Rosario Dalton Rosario Dalton Rosario 10/31/2018, 4:27 PM

## 2018-10-31 NOTE — Progress Notes (Signed)
DAR Note: Pt visible in hall on initial approach. Presents disorganized and confused at intervals. Verbally abusive, "damn this shift, I don't fucking want no medications, that shift don't work", disruptive in milieu as he continued to scream at intervals. Pt did not attend groups when prompted. Required multiple verbal redirections throughout this shift due to impulsive behavior. Took his medications after multiple prompts. Support and encouragement provided to patient as needed. Scheduled medications administered per provider's orders with verbal education and effects monitored. Safety checks maintained on and off unit.   Pt tolerates all PO intake well. Off unit for activities and meals, returned without issues. POC remains effective for safety and mood stability.

## 2018-10-31 NOTE — [facility] Counselor (Signed)
Adult Comprehensive Assessment  Patient ID: Dalton Rosario, male   DOB: Jan 16, 1996, 23 y.o.   MRN: [id number]  Information Source: Information source: Patient  Current Stressors:  Patient states their primary concerns and needs for treatment are:: Nothing really Educational / Learning stressors: (Pt denies current stressors)  Living/Environment/Situation:  Living Arrangements: (Pt currently homeless) Living conditions (as described by patient or guardian): "[place] on the streets [place]. " How long has patient lived in current situation?: 5 years What is atmosphere in current home: Temporary  Family History:  Marital status: Single Are you sexually active?: No What is your sexual orientation?: "I have none anymore" Does patient have children?: No  Childhood History:  By whom was/is the patient raised?: (Pt unable to provide information. ) Additional childhood history information: Pt was in foster care, unsure of circumstances. Does patient have siblings?: No Did patient suffer any verbal/emotional/physical/sexual abuse as a child?: No(Pt denies all, but doubt this is accurate.  Pt was in foster care.) Did patient suffer from severe childhood neglect?: No Has patient ever been sexually abused/assaulted/raped as an adolescent or adult?: No Was the patient ever a victim of a crime or a disaster?: No Witnessed domestic violence?: No Has patient been effected by domestic violence as an adult?: No  Education:  Highest grade of school patient has completed: unsure-did not graduate high school.  Employment/Work Situation:   Employment situation: Unemployed Did You Receive Any Psychiatric Treatment/Services While in Equities trader?: No Are There Guns or Other Weapons in Your Home?: No  Financial Resources:   Financial resources: No income, Medicaid  Alcohol/Substance Abuse:   What has been your use of drugs/alcohol within the last 12 months?: alcohol: pt denies, drugs: pt  denies  Research scientist (medical) System:   Forensic psychologist System: None Type of faith/religion: none  Leisure/Recreation:   Leisure and Hobbies: sports  Strengths/Needs:   What is the patient's perception of their strengths?: singing  Discharge Plan:   Does patient have access to transportation?: No Does patient have financial barriers related to discharge medications?: No Plan for no access to transportation at discharge: bus transportation Will patient be returning to same living situation after discharge?: Yes(homeless)  Summary/Recommendations:   Summary and Recommendations (to be completed by the evaluator): Pt is 23 year old male currently homeless in [place].  Pt is diagnosed with schizophrenia and was admitted due to psychosis.  Recommendations for pt include crisis stabilization, therapeutic milieu, attend and participate in groups, medicaiton management, and development of comprehensive mental wellness plan.    Dalton Rosario. 10/31/2018

## 2018-10-31 NOTE — Plan of Care (Addendum)
D: Patient was sound asleep from beginning of third shift to morning. He rested soundly with even unlabored respirations. Upon waking in the morning patient is cooperative. Denies SI, HI, AVH, and verbally contracts for safety. Patient reports he has no complaints, "is sure everyone else is fine" and asks "is everyone ok?".  Patient denies physical symptoms/pain.    A: Support provided. Patient educated on safety on the unit. Routine safety checks every 15 minutes. Patient stated understanding to tell nurse about any new physical symptoms. Patient understands to tell staff of any needs.     R: No adverse drug reactions noted. Patient verbally contracts for safety. Patient remains safe at this time and will continue to monitor.   Problem: Safety: Goal: Periods of time without injury will increase Outcome: Progressing   Patient remains safe and will continue to monitor.   [place] NOVEL CORONAVIRUS (COVID-19) DAILY CHECK-OFF SYMPTOMS - answer yes or no to each - every day NO YES  Have you had a fever in the past 24 hours?  . Fever (Temp > 37.80C / 100F) X   Have you had any of these symptoms in the past 24 hours? . New Cough .  Sore Throat  .  Shortness of Breath .  Difficulty Breathing .  Unexplained Body Aches   X   Have you had any one of these symptoms in the past 24 hours not related to allergies?   . Runny Nose .  Nasal Congestion .  Sneezing   X   If you have had runny nose, nasal congestion, sneezing in the past 24 hours, has it worsened?  X   EXPOSURES - check yes or no X   Have you traveled outside the state in the past 14 days?  X   Have you been in contact with someone with a confirmed diagnosis of COVID-19 or PUI in the past 14 days without wearing appropriate PPE?  X   Have you been living in the same home as a person with confirmed diagnosis of COVID-19 or a PUI (household contact)?    X   Have you been diagnosed with COVID-19?    X              What to do  next: Answered NO to all: Answered YES to anything:   Proceed with unit schedule Follow the [facility] Inpatient Flowsheet.

## 2018-10-31 NOTE — [facility] Suicide Risk Assessment (Signed)
[facility] INPATIENT:  Family/Significant Other Suicide Prevention Education  Suicide Prevention Education:  Family/Significant Other Refusal to Support Patient after Discharge:  Suicide Prevention Education Not Provided:  Patient has identified home of family/significant other as the place the patient will be residing after discharge.  With written consent of the patient, two attempts were made to provide Suicide Prevention Education to Dalton Rosario, foster parent, [phone], This person indicates he/she will not be responsible for the patient after discharge.  Dalton Rosario is former foster parent.  Pt still stays in touch with her occasionally but she does not see or talk to him a lot.  He called when the virus was getting worse and asked if he could stay with her but she has 2 male foster children currently and told him he couldn't.  Pt has been homeless long term.  His mental health is worse, used to just be anxiety.  He has never stayed on any medication.  He has siblings--one in [place]--but they don't stay in touch with him and gladys does not have any phone numbers.  Dalton Frederick, LCSW 10/31/2018,3:14 PM

## 2018-11-01 MED ORDER — DIPHENHYDRAMINE HCL 50 MG/ML IJ SOLN
50.0000 mg | Freq: Once | INTRAMUSCULAR | Status: DC
  Filled 2018-11-01: qty 1

## 2018-11-01 MED ORDER — HALOPERIDOL LACTATE 5 MG/ML IJ SOLN
INTRAMUSCULAR | Status: AC
  Administered 2018-11-01: 10 mg via INTRAMUSCULAR
  Filled 2018-11-01: qty 2

## 2018-11-01 MED ORDER — HALOPERIDOL DECANOATE 100 MG/ML IM SOLN
150.0000 mg | Freq: Once | INTRAMUSCULAR | Status: AC
  Administered 2018-11-01: 150 mg via INTRAMUSCULAR
  Filled 2018-11-01: qty 1.5

## 2018-11-01 MED ORDER — LORAZEPAM 2 MG/ML IJ SOLN: 2.0000 mg | Freq: Once | INTRAMUSCULAR | Status: DC

## 2018-11-01 MED ORDER — HALOPERIDOL LACTATE 5 MG/ML IJ SOLN
10.0000 mg | Freq: Once | INTRAMUSCULAR | Status: AC
  Administered 2018-11-01: 10:00:00 10 mg via INTRAMUSCULAR

## 2018-11-01 MED ORDER — LORAZEPAM 2 MG/ML IJ SOLN: 4.0000 mg | Freq: Once | INTRAMUSCULAR | Status: DC

## 2018-11-01 NOTE — Plan of Care (Signed)
Progress note  D: pt found in the dayroom; non-compliant with morning meds. Pt was being disruptive in the dayroom and had to be given IM PRN's. Pt compliant with his long acting. Pt denies si/hi/ah/vh and verbally agrees to approach staff if these become apparent or before harming himself/others while at [facility]. Pt is still intrusive, argumentative, and attention seeking.  A: pt provided support and encouragement. Pt given medication per protocol and standing orders. Q17m safety checks implemented and continued.  R: pt safe on the unit. Will continue to monitor.   Pt progressing in the following metrics  Problem: Education: Goal: Utilization of techniques to improve thought processes will improve Outcome: Progressing   Problem: Health Behavior/Discharge Planning: Goal: Compliance with therapeutic regimen will improve Outcome: Progressing   Problem: Role Relationship: Goal: Will demonstrate positive changes in social behaviors and relationships Outcome: Progressing   Problem: Safety: Goal: Ability to identify and utilize support systems that promote safety will improve Outcome: Progressing

## 2018-11-01 NOTE — Progress Notes (Signed)
SPIRITUAL CARE GROUP     spiritual care group facilitated by chaplain Burnis Kingfisher, MDiv, [facility]    Group focused on topic of Hope.  Pts engaged in facilitated dialog around meaning of hope.  Participated in visual explorer exercise to define hope for themselves today.   Tramond was invited to attend group   Did not attend    Burnis Kingfisher, MDIv, [facility]

## 2018-11-01 NOTE — Progress Notes (Signed)
[facility] MD Progress Note  11/01/2018 9:20 AM Dalton Rosario  MRN:  [id number] Subjective:    Patient began rounds by making short nonsensical statements vague threats then he progressed to singing and clapping loudly and bouncing around in the day room-no specific song just sort of chanting very loudly, inciting other patients towards instability and agitating them. Because he is so loud and disruptive were simply going to have to force medications as we have too many psychotic and manic patients at this point refusing medications to tolerate this type of disruption as again he is inciting them towards more instability and volatility Principal Problem: un-Treated psychosis Diagnosis: Active Problems:   Psychosis (HCC)  Total Time spent with patient: 20 minutes  Past Medical History:  Past Medical History:  Diagnosis Date  . ADHD (attention deficit hyperactivity disorder)   . Anxiety   . Asthma   . Depression     Past Surgical History:  Procedure Laterality Date  . APPENDECTOMY     Pt was 23 yo   Family History: History reviewed. No pertinent family history.  Social History:  Social History   Substance and Sexual Activity  Alcohol Use No     Social History   Substance and Sexual Activity  Drug Use No    Social History   Socioeconomic History  . Marital status: Single    Spouse name: Not on file  . Number of children: Not on file  . Years of education: Not on file  . Highest education level: Not on file  Occupational History  . Not on file  Social Needs  . Financial resource strain: Not on file  . Food insecurity:    Worry: Not on file    Inability: Not on file  . Transportation needs:    Medical: Not on file    Non-medical: Not on file  Tobacco Use  . Smoking status: Current Every Day Smoker    Packs/day: 0.50    Types: Cigarettes  . Smokeless tobacco: Never Used  Substance and Sexual Activity  . Alcohol use: No  . Drug use: No  . Sexual activity: Never   Lifestyle  . Physical activity:    Days per week: Not on file    Minutes per session: Not on file  . Stress: Not on file  Relationships  . Social connections:    Talks on phone: Not on file    Gets together: Not on file    Attends religious service: Not on file    Active member of club or organization: Not on file    Attends meetings of clubs or organizations: Not on file    Relationship status: Not on file  Other Topics Concern  . Not on file  Social History Narrative  . Not on file   Additional Social History:                         Sleep: Fair  Appetite:  Fair  Current Medications: Current Facility-Administered Medications  Medication Dose Route Frequency Provider Last Rate Last Dose  . benztropine (COGENTIN) tablet 1 mg  1 mg Oral BID Malvin Johns, MD      . haloperidol (HALDOL) 2 MG/ML solution 10 mg  10 mg Oral Daily Malvin Johns, MD      . haloperidol (HALDOL) 2 MG/ML solution 15 mg  15 mg Oral QHS Malvin Johns, MD   15 mg at 10/31/18 2124  . haloperidol decanoate (HALDOL DECANOATE)  100 MG/ML injection 150 mg  150 mg Intramuscular Once Malvin Johns, MD      . haloperidol lactate (HALDOL) injection 10 mg  10 mg Intramuscular Once Malvin Johns, MD      . LORazepam (ATIVAN) injection 4 mg  4 mg Intravenous Once Malvin Johns, MD      . OLANZapine zydis (ZYPREXA) disintegrating tablet 10 mg  10 mg Oral Q8H PRN Kerry Hough, PA-C       And  . LORazepam (ATIVAN) tablet 1 mg  1 mg Oral PRN Donell Sievert E, PA-C       And  . ziprasidone (GEODON) injection 20 mg  20 mg Intramuscular PRN Kerry Hough, PA-C      . traZODone (DESYREL) tablet 200 mg  200 mg Oral QHS,MR X 1 Malvin Johns, MD   Stopped at 11/01/18 0200    Lab Results: No results found for this or any previous visit (from the past 48 hour(s)).  Blood Alcohol level:  Lab Results  Component Value Date   [facility] <10 10/26/2018   ETH <11 06/19/2012    Metabolic Disorder Labs: Lab Results   Component Value Date   HGBA1C 5.4 06/21/2012   MPG 108 06/21/2012   No results found for: PROLACTIN Lab Results  Component Value Date   CHOL 201 (H) 06/21/2012   TRIG 207 (H) 06/21/2012   HDL 42 06/21/2012   CHOLHDL 4.8 06/21/2012   VLDL 41 (H) 06/21/2012   LDLCALC 118 (H) 06/21/2012    Physical Findings: AIMS: Facial and Oral Movements Muscles of Facial Expression: None, normal Lips and Perioral Area: None, normal Jaw: None, normal Tongue: None, normal,Extremity Movements Upper (arms, wrists, hands, fingers): None, normal Lower (legs, knees, ankles, toes): None, normal, Trunk Movements Neck, shoulders, hips: None, normal, Overall Severity Severity of abnormal movements (highest score from questions above): None, normal Incapacitation due to abnormal movements: None, normal Patient's awareness of abnormal movements (rate only patient's report): No Awareness, Dental Status Current problems with teeth and/or dentures?: No Does patient usually wear dentures?: No  CIWA:    COWS:     Musculoskeletal: Strength & Muscle Tone: within normal limits Gait & Station: normal Patient leans: N/A  Psychiatric Specialty Exam: Physical Exam  ROS  Blood pressure (!) 136/92, pulse (!) 53, temperature 98.1 F (36.7 C), resp. rate 18, height  (1.702 m), weight 93 kg.Body mass index is 32.11 kg/m.  General Appearance: Disheveled  Eye Contact:  Minimal  Speech:  Pressured  Volume:  Increased  Mood:  Euphoric/labile/sarcastic/nonsensical statements  Affect:  Labile  Thought Process:  Disorganized  Orientation:  "call me Chief Technology Officer"  Thought Content:  Illogical  Suicidal Thoughts:  No  Homicidal Thoughts:  No  Memory:  Immediate;   Poor  Judgement:  Impaired  Insight:  Lacking  Psychomotor Activity:  Restlessness  Concentration:  Concentration: Poor  Recall:  Poor  Fund of Knowledge:  Poor  Language:  Poor  Akathisia:  Negative  Handed:  Right  AIMS (if indicated):      Assets:  Physical Health Resilience Social Support  ADL's:  Intact  Cognition:  WNL  Sleep:  Number of Hours: 6.75     Treatment Plan Summary: Daily contact with patient to assess and evaluate symptoms and progress in treatment, Medication management and Plan Had one moment of compliance with 1 dose of Haldol yesterday required to simply forced meds at this point in time as he is quite agitated disruptive so forth  Malvin JohnsFARAH,Jeidy Hoerner, MD 11/01/2018, 9:20 AM

## 2018-11-01 NOTE — Progress Notes (Addendum)
D: Patient observed sleeping soundly at start of shift. Awoke around 2100. Animated, silly, and sarcastic. Patient grandiose, delusional.  When asked about AVH patient laughs and states, "of course! Everyone does!" He does admit however that "my head isn't right." Denies pain but reports feeling dizzy. No respiratory symptoms noted, negative COVID-19 screen, afebrile.   A: Medicated per orders, no prns requested or required. Medication education provided. Level III obs in place for safety. Fall prevention plan in place and reviewed.   R: Patient verbalizes little to no understanding of POC, falls prevention education. Patient denies SI/HI and remains safe on level III obs. Will continue to monitor throughout the night.

## 2018-11-02 MED ORDER — LIDOCAINE 5 % EX PTCH
1.0000 | MEDICATED_PATCH | CUTANEOUS | Status: DC
  Filled 2018-11-02 (×4): qty 1

## 2018-11-02 NOTE — Progress Notes (Signed)
Writer spoke with patient 1:1 and he was anxious and animated with his responses. He was singing and clapping his hands in the dayroom while others were watching TV. He was twirling around and around and writer asked him to come and take his hs medication. He reports being homeless since leaving his foster home at 23 years old. He currently is not sure where he will be going once discharged. Writer encouraged him to not disturb others while watching TV with his singing. Safety maintained on unit with 15 min checks.

## 2018-11-02 NOTE — Progress Notes (Signed)
D: Patient observed standing at nurses' station. Behavior and affect remain bizarre but patient has been redirectable this evening. Patient states, "I am breathing for all tonight. It's just a big burden. I have to breathe for everyone."  Denies pain, physical complaints. COVID-19 screen negative, afebrile. Respiratory assessment WDL.  A: Medicated per orders, no prns requested or required. Medication education provided. Level III obs in place for safety. Emotional support offered. Patient encouraged to complete Suicide Safety Plan before discharge. Encouraged to attend and participate in unit programming. Patient's behavior redirected as needed.  R: Patient verbalizes understanding of POC. Patient denies SI/HI but when asked about AVH states, "well of course. Everyone does." Patient remains safe on level III obs. Will continue to monitor throughout the night.

## 2018-11-02 NOTE — Progress Notes (Addendum)
[facility]  11/02/2018 2:35 PM Dalton Rosario  MRN:  [id number]  Evaluation: Dalton Rosario is seen resting in bed. reports feeling tired and is experiencing lower back pain.  Patient reports he is unable to recall reason for admission.  He denies suicidal or homicidal ideations.  Denies auditory visual hallucinations.  Staff reports patient taking medications as prescribed and tolerating them well.  Charted patient continues to present bizarre and disheveled. Denies hallucinations or paranoia ideation with sharp "no" responses.    Patient is minimal throughout this assessment.  We will continue to monitor for safety.  Support encouragement reassurance was provided   Principal Problem: Psychotic disorder Diagnosis: Active Problems:   Psychosis (HCC)  Total Time spent with patient: 20 minutes  Past Medical History:  Past Medical History:  Diagnosis Date  . ADHD (attention deficit hyperactivity disorder)   . Anxiety   . Asthma   . Depression     Past Surgical History:  Procedure Laterality Date  . APPENDECTOMY     Pt was 23 yo   Family History: History reviewed. No pertinent family history.  Social History:  Social History   Substance and Sexual Activity  Alcohol Use No     Social History   Substance and Sexual Activity  Drug Use No    Social History   Socioeconomic History  . Marital status: Single    Spouse name: Not on file  . Number of children: Not on file  . Years of education: Not on file  . Highest education level: Not on file  Occupational History  . Not on file  Social Needs  . Financial resource strain: Not on file  . Food insecurity:    Worry: Not on file    Inability: Not on file  . Transportation needs:    Medical: Not on file    Non-medical: Not on file  Tobacco Use  . Smoking status: Current Every Day Smoker    Packs/day: 0.50    Types: Cigarettes  . Smokeless tobacco: Never Used  Substance and Sexual Activity  . Alcohol use: No  . Drug  use: No  . Sexual activity: Never  Lifestyle  . Physical activity:    Days per week: Not on file    Minutes per session: Not on file  . Stress: Not on file  Relationships  . Social connections:    Talks on phone: Not on file    Gets together: Not on file    Attends religious service: Not on file    Active member of club or organization: Not on file    Attends meetings of clubs or organizations: Not on file    Relationship status: Not on file  Other Topics Concern  . Not on file  Social History Narrative  . Not on file   Additional Social History:                         Sleep: Good  Appetite:  Good  Current Medications: Current Facility-Administered Medications  Medication Dose Route Frequency Provider Last Rate Last Dose  . benztropine (COGENTIN) tablet 1 mg  1 mg Oral BID Malvin JohnsFarah, Brian, MD   1 mg at 11/02/18 0816  . diphenhydrAMINE (BENADRYL) injection 50 mg  50 mg Intramuscular Once Malvin JohnsFarah, Brian, MD   Stopped at 11/01/18 1000  . haloperidol (HALDOL) 2 MG/ML solution 10 mg  10 mg Oral Daily Malvin JohnsFarah, Brian, MD   10 mg at 11/02/18  0815  . haloperidol (HALDOL) 2 MG/ML solution 15 mg  15 mg Oral QHS Malvin Johns, MD   15 mg at 11/01/18 2051  . LORazepam (ATIVAN) injection 2 mg  2 mg Intramuscular Once Malvin Johns, MD   Stopped at 11/01/18 1030  . OLANZapine zydis (ZYPREXA) disintegrating tablet 10 mg  10 mg Oral Q8H PRN Kerry Hough, PA-C       And  . LORazepam (ATIVAN) tablet 1 mg  1 mg Oral PRN Donell Sievert E, PA-C       And  . ziprasidone (GEODON) injection 20 mg  20 mg Intramuscular PRN Kerry Hough, PA-C      . traZODone (DESYREL) tablet 200 mg  200 mg Oral QHS,MR X 1 Malvin Johns, MD   Stopped at 11/02/18 0138    Lab Results: No results found for this or any previous visit (from the past 48 hour(s)).  Blood Alcohol level:  Lab Results  Component Value Date   [facility] <10 10/26/2018   ETH <11 06/19/2012    Metabolic Disorder Labs: Lab Results   Component Value Date   HGBA1C 5.4 06/21/2012   MPG 108 06/21/2012   No results found for: PROLACTIN Lab Results  Component Value Date   CHOL 201 (H) 06/21/2012   TRIG 207 (H) 06/21/2012   HDL 42 06/21/2012   CHOLHDL 4.8 06/21/2012   VLDL 41 (H) 06/21/2012   LDLCALC 118 (H) 06/21/2012    Physical Findings: AIMS: Facial and Oral Movements Muscles of Facial Expression: None, normal Lips and Perioral Area: None, normal Jaw: None, normal Tongue: None, normal,Extremity Movements Upper (arms, wrists, hands, fingers): None, normal Lower (legs, knees, ankles, toes): None, normal, Trunk Movements Neck, shoulders, hips: None, normal, Overall Severity Severity of abnormal movements (highest score from questions above): None, normal Incapacitation due to abnormal movements: None, normal Patient's awareness of abnormal movements (rate only patient's report): No Awareness, Dental Status Current problems with teeth and/or dentures?: No Does patient usually wear dentures?: No  CIWA:    COWS:     Musculoskeletal: Strength & Muscle Tone: within normal limits Gait & Station: normal Patient leans: N/A  Psychiatric Specialty Exam: Physical Exam  Constitutional: He appears well-developed.    ROS  Blood pressure (!) 136/92, pulse (!) 53, temperature 98.1 F (36.7 C), resp. rate 18, height  (1.702 m), weight 93 kg.Body mass index is 32.11 kg/m.  General Appearance: Disheveled, malodorous   Eye Contact:  Minimal  Speech:  Clear and Coherent  Volume:  Increased  Mood:  Irritable and labile  Affect:  Non-Congruent  Thought Process:  Coherent  Orientation:  Other:  person place  Thought Content:  Illogical and Delusions  Suicidal Thoughts:  No  Homicidal Thoughts:  No  Memory:  Immediate;   Poor  Judgement:  Impaired  Insight:  Lacking  Psychomotor Activity:  Restlessness  Concentration:  Attention Span: Poor  Recall:  Poor  Fund of Knowledge:  Poor  Language:  Poor   Akathisia:  Negative  Handed:  Right  AIMS (if indicated):     Assets:  Resilience Social Support  ADL's:  Intact  Cognition:  WNL  Sleep:  Number of Hours: 6.75     Treatment Plan Summary: Daily contact with patient to assess and evaluate symptoms and progress in treatment and Medication management   Continue with current treatment plan on 11/02/2018 as listed below except for noted  Psychosis:  Continue Haldol 10 mg p.o. daily and 50 mg  p.o. nightly Continue trazodone 200 mg p.o. nightly  Continue Cogentin 1 mg p.o. twice daily  Lower back pain - lidocaine 5% patch   CSW to continue working on discharge disposition Patient encouraged to participate within the therapeutic milieu   Oneta Rack, NP 11/02/2018, 2:35 PM

## 2018-11-02 NOTE — [facility] Group Notes (Signed)
LCSW Group Therapy Note  11/02/2018    11:15am-12:00pm   Type of Therapy and Topic:  Group Therapy: Messages Received About Mental Illness  Participation Level:  Did Not Attend   Description of Group:   In this group, patients shared and discussed the messages received in their lives about the issue of mental illness through parental or other family members' conversations, examples, teaching, repression, punishment, access to care, and more.  Participants identified how those childhood lessons influence even now their own feelings about seeking and/or accepting treatment for their mental health issues.  We discussed the costs and benefits of allowing ourselves to accept a diagnosis of a mental illness, as well as ongoing treatment.   Therapeutic Goals: 1. Patients will identify at least one message about mental illness that they have received and how it was taught to them. 2. Patients will discuss how these experiences have influenced and continue to influence their own acceptance of a mental health diagnosis and/or treatment even today. 3. Patients will explore possible primary costs and benefits of accepting a diagnosis and/or treatment. 4. Patients will learn that they have much in common with other group members and will receive encouragement from group leader and each other to be their true selves, even with flaws.  Summary of Patient Progress:  N/A - did not attend  Therapeutic Modalities:   Cognitive Behavioral Therapy Motivation Interviewing  Dalton Rosario  .

## 2018-11-02 NOTE — Progress Notes (Signed)
D. Pt presents with a flat affect, bizarre behavior, observed standing at the nurse's station this am upon initial approach- .  Pt forwards little information during conversation- giving one word answers to questions - denies pain, SI/HI and AVH and agrees to contact staff before acting on any harmful thoughts.  A. Labs and vitals monitored. Pt compliant with medications. Pt supported emotionally and encouraged to express concerns and ask questions.   R. Pt remains safe with 15 minute checks. Will continue POC.

## 2018-11-02 NOTE — Progress Notes (Signed)
[place] NOVEL CORONAVIRUS (COVID-19) DAILY CHECK-OFF SYMPTOMS - answer yes or no to each - every day NO YES  Have you had a fever in the past 24 hours?  . Fever (Temp > 37.80C / 100F) X   Have you had any of these symptoms in the past 24 hours? . New Cough .  Sore Throat  .  Shortness of Breath .  Difficulty Breathing .  Unexplained Body Aches   X   Have you had any one of these symptoms in the past 24 hours not related to allergies?   . Runny Nose .  Nasal Congestion .  Sneezing   X   If you have had runny nose, nasal congestion, sneezing in the past 24 hours, has it worsened?  X   EXPOSURES - check yes or no X   Have you traveled outside the state in the past 14 days?  X   Have you been in contact with someone with a confirmed diagnosis of COVID-19 or PUI in the past 14 days without wearing appropriate PPE?  X   Have you been living in the same home as a person with confirmed diagnosis of COVID-19 or a PUI (household contact)?    X   Have you been diagnosed with COVID-19?    X              What to do next: Answered NO to all: Answered YES to anything:   Proceed with unit schedule Follow the [facility] Inpatient Flowsheet.   

## 2018-11-03 NOTE — Progress Notes (Signed)
[place] NOVEL CORONAVIRUS (COVID-19) DAILY CHECK-OFF SYMPTOMS - answer yes or no to each - every day NO YES  Have you had a fever in the past 24 hours?  . Fever (Temp > 37.80C / 100F) X   Have you had any of these symptoms in the past 24 hours? . New Cough .  Sore Throat  .  Shortness of Breath .  Difficulty Breathing .  Unexplained Body Aches   X   Have you had any one of these symptoms in the past 24 hours not related to allergies?   . Runny Nose .  Nasal Congestion .  Sneezing   X   If you have had runny nose, nasal congestion, sneezing in the past 24 hours, has it worsened?  X   EXPOSURES - check yes or no X   Have you traveled outside the state in the past 14 days?  X   Have you been in contact with someone with a confirmed diagnosis of COVID-19 or PUI in the past 14 days without wearing appropriate PPE?  X   Have you been living in the same home as a person with confirmed diagnosis of COVID-19 or a PUI (household contact)?    X   Have you been diagnosed with COVID-19?    X              What to do next: Answered NO to all: Answered YES to anything:   Proceed with unit schedule Follow the [facility] Inpatient Flowsheet.   

## 2018-11-03 NOTE — [facility] Group Notes (Signed)
[facility] LCSW Group Therapy Note  Date/Time:  11/03/2018  10:00AM-11:00AM  Type of Therapy and Topic:  Group Therapy:  Music and Mood  Participation Level:  Active   Description of Group: In this process group, members listened to a variety of genres of music and identified that different types of music evoke different responses.  Patients were encouraged to identify music that was soothing for them and music that was energizing for them.  Patients discussed how this knowledge can help with wellness and recovery in various ways including managing depression and anxiety as well as encouraging healthy sleep habits.    Therapeutic Goals: 1. Patients will explore the impact of different varieties of music on mood 2. Patients will verbalize the thoughts they have when listening to different types of music 3. Patients will identify music that is soothing to them as well as music that is energizing to them 4. Patients will discuss how to use this knowledge to assist in maintaining wellness and recovery 5. Patients will explore the use of music as a coping skill  Summary of Patient Progress:  At the beginning of group, patient expressed that he felt "okay."  He participated fully throughout the group and knew many of the songs, sang along.  At the end of group he said he felt "better."  Therapeutic Modalities: Solution Focused Brief Therapy Activity   Ambrose Mantle, LCSW

## 2018-11-03 NOTE — Progress Notes (Signed)
Nursing Note: Pt mood labile,hyperverbal laughing joking ." I have stuff coming out of my nose watch me ha ha". Pt frequently in front of nursing station needing some redirection. Pt is laughing to self but denies A/v hall. "I'm homeless but I don't need a place to live, I always seem to find one." Pt laughing at self. appetite is  good and reports sleep is fair.   A- Support and Encouragement provided, Allowed patient to ventilate during 1:1.  R- Will continue to monitor on q 15 minute checks for safety, compliant with medications and programing

## 2018-11-03 NOTE — Progress Notes (Signed)
Writer has observed patient up in the dayroom watching tv and appears much calmer tonight. He was compliant with is scheduled medication and returned to watch tv. Support given and safety maintained on unit with 15 min.

## 2018-11-03 NOTE — Progress Notes (Addendum)
[facility] MD Progress Note  11/03/2018 9:07 AM Dalton Rosario  MRN:  [id number]  Evaluation: Dalton Rosario" observed standing at nurses station interacting with peers.  Patient appears hyperverbal and pressured speech.  Reported homelessness to his peer. however during this assessment patient was not engaged.  He answered all questions with "no."  Denying suicidal or homicidal ideations.  Denies auditory or  visual hallucinations.  Patient denied any concerns with medication and no side effects.  Support encouragement reassurance was provided.   Principal Problem: Psychotic disorder Diagnosis: Active Problems:   Psychosis (HCC)  Total Time spent with patient: 20 minutes  Past Medical History:  Past Medical History:  Diagnosis Date  . ADHD (attention deficit hyperactivity disorder)   . Anxiety   . Asthma   . Depression     Past Surgical History:  Procedure Laterality Date  . APPENDECTOMY     Pt was 23 yo   Family History: History reviewed. No pertinent family history.  Social History:  Social History   Substance and Sexual Activity  Alcohol Use No     Social History   Substance and Sexual Activity  Drug Use No    Social History   Socioeconomic History  . Marital status: Single    Spouse name: Not on file  . Number of children: Not on file  . Years of education: Not on file  . Highest education level: Not on file  Occupational History  . Not on file  Social Needs  . Financial resource strain: Not on file  . Food insecurity:    Worry: Not on file    Inability: Not on file  . Transportation needs:    Medical: Not on file    Non-medical: Not on file  Tobacco Use  . Smoking status: Current Every Day Smoker    Packs/day: 0.50    Types: Cigarettes  . Smokeless tobacco: Never Used  Substance and Sexual Activity  . Alcohol use: No  . Drug use: No  . Sexual activity: Never  Lifestyle  . Physical activity:    Days per week: Not on file    Minutes per session: Not on  file  . Stress: Not on file  Relationships  . Social connections:    Talks on phone: Not on file    Gets together: Not on file    Attends religious service: Not on file    Active member of club or organization: Not on file    Attends meetings of clubs or organizations: Not on file    Relationship status: Not on file  Other Topics Concern  . Not on file  Social History Narrative  . Not on file   Additional Social History:                         Sleep: Good  Appetite:  Good  Current Medications: Current Facility-Administered Medications  Medication Dose Route Frequency Provider Last Rate Last Dose  . benztropine (COGENTIN) tablet 1 mg  1 mg Oral BID Malvin Johns, MD   1 mg at 11/03/18 0754  . diphenhydrAMINE (BENADRYL) injection 50 mg  50 mg Intramuscular Once Malvin Johns, MD   Stopped at 11/01/18 1000  . haloperidol (HALDOL) 2 MG/ML solution 10 mg  10 mg Oral Daily Malvin Johns, MD   10 mg at 11/03/18 0754  . haloperidol (HALDOL) 2 MG/ML solution 15 mg  15 mg Oral QHS Malvin Johns, MD   15 mg  at 11/02/18 2114  . lidocaine (LIDODERM) 5 % 1 patch  1 patch Transdermal Q24H Oneta RackLewis, Tanika N, NP      . LORazepam (ATIVAN) injection 2 mg  2 mg Intramuscular Once Malvin JohnsFarah, Brian, MD   Stopped at 11/01/18 1030  . OLANZapine zydis (ZYPREXA) disintegrating tablet 10 mg  10 mg Oral Q8H PRN Kerry HoughSimon, Spencer E, PA-C       And  . LORazepam (ATIVAN) tablet 1 mg  1 mg Oral PRN Donell SievertSimon, Spencer E, PA-C       And  . ziprasidone (GEODON) injection 20 mg  20 mg Intramuscular PRN Kerry HoughSimon, Spencer E, PA-C      . traZODone (DESYREL) tablet 200 mg  200 mg Oral QHS,MR X 1 Malvin JohnsFarah, Brian, MD   200 mg at 11/02/18 2112    Lab Results: No results found for this or any previous visit (from the past 48 hour(s)).  Blood Alcohol level:  Lab Results  Component Value Date   [facility]   ETH <11 06/19/2012    Metabolic Disorder Labs: Lab Results  Component Value Date   HGBA1C 5.4 06/21/2012    MPG 108 06/21/2012   No results found for: PROLACTIN Lab Results  Component Value Date   CHOL 201 (H) 06/21/2012   TRIG 207 (H) 06/21/2012   HDL 42 06/21/2012   CHOLHDL 4.8 06/21/2012   VLDL 41 (H) 06/21/2012   LDLCALC 118 (H) 06/21/2012    Physical Findings: AIMS: Facial and Oral Movements Muscles of Facial Expression: None, normal Lips and Perioral Area: None, normal Jaw: None, normal Tongue: None, normal,Extremity Movements Upper (arms, wrists, hands, fingers): None, normal Lower (legs, knees, ankles, toes): None, normal, Trunk Movements Neck, shoulders, hips: None, normal, Overall Severity Severity of abnormal movements (highest score from questions above): None, normal Incapacitation due to abnormal movements: None, normal Patient's awareness of abnormal movements (rate only patient's report): No Awareness, Dental Status Current problems with teeth and/or dentures?: No Does patient usually wear dentures?: No  CIWA:    COWS:     Musculoskeletal: Strength & Muscle Tone: within normal limits Gait & Station: normal Patient leans: N/A  Psychiatric Specialty Exam: Physical Exam  Constitutional: He appears well-developed.    ROS  Blood pressure (!) 136/92, pulse (!) 53, temperature 98.1 F (36.7 C), resp. rate 18, height 5\' 7"  (1.702 m), weight 93 kg.Body mass index is 32.11 kg/m.  General Appearance: Disheveled, malodorous   Eye Contact:  Minimal  Speech:  Clear and Coherent  Volume:  Increased  Mood:  Irritable and labile  Affect:  Non-Congruent  Thought Process:  Coherent  Orientation:  Other:  person place  Thought Content:  Illogical and Delusions  Suicidal Thoughts:  No  Homicidal Thoughts:  No  Memory:  Immediate;   Poor  Judgement:  Impaired  Insight:  Lacking  Psychomotor Activity:  Restlessness  Concentration:  Attention Span: Poor  Recall:  Poor  Fund of Knowledge:  Poor  Language:  Poor  Akathisia:  Negative  Handed:  Right  AIMS (if  indicated):     Assets:  Resilience Social Support  ADL's:  Intact  Cognition:  WNL  Sleep:  Number of Hours: 6.5     Treatment Plan Summary: Daily contact with patient to assess and evaluate symptoms and progress in treatment and Medication management   Continue with current treatment plan on 11/03/2018 as listed below except for noted  Psychosis:  Continue Haldol 10 mg p.o. daily and 50 mg  p.o. nightly Continue trazodone 200 mg p.o. nightly  Continue Cogentin 1 mg p.o. twice daily  Lower back pain - lidocaine 5% patch   CSW to continue working on discharge disposition Patient encouraged to participate within the therapeutic milieu   Oneta Rack, NP 11/03/2018, 9:07 AM  Attest to NP Progress Note

## 2018-11-04 MED ORDER — HALOPERIDOL 20 MG PO TABS: 20.0000 mg | ORAL_TABLET | Freq: Every day | ORAL | 2 refills | Status: DC

## 2018-11-04 MED ORDER — HALOPERIDOL DECANOATE 50 MG/ML IM SOLN: 150.0000 mg | INTRAMUSCULAR | 11 refills | Status: DC

## 2018-11-04 MED ORDER — BENZTROPINE MESYLATE 1 MG PO TABS: 1.0000 mg | ORAL_TABLET | Freq: Two times a day (BID) | ORAL | 2 refills | Status: DC

## 2018-11-04 MED ORDER — FLUTICASONE PROPIONATE 50 MCG/ACT NA SUSP: 2.0000 | Freq: Every day | NASAL | 2 refills | Status: DC

## 2018-11-04 MED ORDER — FLUTICASONE PROPIONATE 50 MCG/ACT NA SUSP
2.0000 | Freq: Every day | NASAL | Status: DC
  Filled 2018-11-04 (×2): qty 16

## 2018-11-04 NOTE — Discharge Summary (Signed)
Physician Discharge Summary Note  Patient:  Dalton Rosario is an 23 y.o., male MRN:  [id number] DOB:  1996-05-25 Patient phone:  [phone] (home)  Patient address:   [address]  [place] [address],  Total Time spent with patient: 45 minutes  Date of Admission:  10/29/2018 Date of Discharge: 11/04/2018  Reason for Admission:   Dalton Rosario is 23 years of age he has been diagnosed with schizophrenia according to information from his foster mother, he is homeless chronically as well.  He presented through the emergency department on 4/11 after some type of trauma that was never fully recalled elaborated by the patient but he had a bloody nose and he also had apneumomediastinum.  He was found by a passerby and had been just noted to be sitting on the sidewalk behaving bizarrely  He was poorly cooperative in the emergency department further he was poorly cooperative on the medical floor at times attempting to leave, and known to be psychotic throughout the time he had been in this healthcare system since 4/11, further complicated by refusal of medications.  His interview is informative only and that it reflects his psychotic state he simply echoes the questions in a sarcastic fashion and jokingly states "now you are confused" so he is conversant but making no sense at all his drug screen showed benzodiazepines and cannabis.  He would not answer with aggressive substance abuse in the controlled substance databank shows no prescriptions for him at all certainly none for benzodiazepines but no scheduled prescriptions have been written over the last 3 years  So currently he is alert and oriented to his general situation we presume and again giving mocking answer simply repeating the questions but becomes more conversant with fellow patients but simply not making much sense to them either.   Principal Problem: Chronic untreated psychotic disorder Discharge Diagnoses: Active Problems:  Psychosis [facility]) Past Medical History:  Past Medical History:  Diagnosis Date  . ADHD (attention deficit hyperactivity disorder)   . Anxiety   . Asthma   . Depression     Past Surgical History:  Procedure Laterality Date  . APPENDECTOMY     Pt was 24 yo   Family History: History reviewed. No pertinent family history.  Social History:  Social History   Substance and Sexual Activity  Alcohol Use No     Social History   Substance and Sexual Activity  Drug Use No    Social History   Socioeconomic History  . Marital status: Single    Spouse name: Not on file  . Number of children: Not on file  . Years of education: Not on file  . Highest education level: Not on file  Occupational History  . Not on file  Social Needs  . Financial resource strain: Not on file  . Food insecurity:    Worry: Not on file    Inability: Not on file  . Transportation needs:    Medical: Not on file    Non-medical: Not on file  Tobacco Use  . Smoking status: Current Every Day Smoker    Packs/day: 0.50    Types: Cigarettes  . Smokeless tobacco: Never Used  Substance and Sexual Activity  . Alcohol use: No  . Drug use: No  . Sexual activity: Never  Lifestyle  . Physical activity:    Days per week: Not on file    Minutes per session: Not on file  . Stress: Not on file  Relationships  . Social  connections:    Talks on phone: Not on file    Gets together: Not on file    Attends religious service: Not on file    Active member of club or organization: Not on file    Attends meetings of clubs or organizations: Not on file    Relationship status: Not on file  Other Topics Concern  . Not on file  Social History Narrative  . Not on file    Hospital Course:    Patient was admitted involuntarily and generally refused medication and intervention he gave disjointed and nonsensical answers throughout his stay until the last day or so.  Only when his behavior got so disruptive were forced  medications given at that point we also administer long-acting injectable haloperidol. He received 150 mg IM his next dose is due on or around 11/30/2018  By the date of the 20th he was still making some unusual disjointed statements, rambling but he was behaviorally contained and he was redirectable and could give a few coherent sentences.  We felt this was as good as it was going to get with him as he was now alert oriented to person and general situation and had long-acting injectable on board.  He would not cooperate with group home placement any sort of placement whatsoever.  Discharge to the shelter  Physical Findings: AIMS: Facial and Oral Movements Muscles of Facial Expression: None, normal Lips and Perioral Area: None, normal Jaw: None, normal Tongue: None, normal,Extremity Movements Upper (arms, wrists, hands, fingers): None, normal Lower (legs, knees, ankles, toes): None, normal, Trunk Movements Neck, shoulders, hips: None, normal, Overall Severity Severity of abnormal movements (highest score from questions above): None, normal Incapacitation due to abnormal movements: None, normal Patient's awareness of abnormal movements (rate only patient's report): No Awareness, Dental Status Current problems with teeth and/or dentures?: No Does patient usually wear dentures?: No  CIWA:    COWS:     Musculoskeletal: Strength & Muscle Tone: within normal limits Gait & Station: normal Patient leans: N/A  Psychiatric Specialty Exam: Physical Exam  ROS  Blood pressure (!) 136/92, pulse (!) 53, temperature 98.1 F (36.7 C), resp. rate 18, height  (1.702 m), weight 93 kg.Body mass index is 32.11 kg/m.  General Appearance: Casual  Eye Contact:  Fair  Speech:  Clear and Coherent  Volume:  Normal  Mood:  Euthymic  Affect:  Congruent  Thought Process:  Irrelevant and Descriptions of Associations: Loose  Orientation:  Full (Time, Place, and Person) not exact date  Thought  Content:  Illogical  Suicidal Thoughts:  No  Homicidal Thoughts:  No  Memory:  Immediate;   Fair  Judgement:  Impaired  Insight:  Lacking  Psychomotor Activity:  Normal  Concentration:  Concentration: Poor  Recall:  Poor  Fund of Knowledge:  Poor  Language:  Good  Akathisia:  Negative  Handed:  Right  AIMS (if indicated):     Assets:  Physical Health  ADL's:  Intact  Cognition:  WNL  Sleep:  Number of Hours: 6.5     Have you used any form of tobacco in the last 30 days? (Cigarettes, Smokeless Tobacco, Cigars, and/or Pipes): Patient Refused Screening  Has this patient used any form of tobacco in the last 30 days? (Cigarettes, Smokeless Tobacco, Cigars, and/or Pipes) Yes, No  Blood Alcohol level:  Lab Results  Component Value Date   [facility] <10 10/26/2018   ETH <11 06/19/2012    Metabolic Disorder Labs:  Lab Results  Component Value Date   HGBA1C 5.4 06/21/2012   MPG 108 06/21/2012   No results found for: PROLACTIN Lab Results  Component Value Date   CHOL 201 (H) 06/21/2012   TRIG 207 (H) 06/21/2012   HDL 42 06/21/2012   CHOLHDL 4.8 06/21/2012   VLDL 41 (H) 06/21/2012   LDLCALC 118 (H) 06/21/2012    See Psychiatric Specialty Exam and Suicide Risk Assessment completed by Attending Physician prior to discharge.  Discharge destination:  Home  Is patient on multiple antipsychotic therapies at discharge:  No   Has Patient had three or more failed trials of antipsychotic monotherapy by history:  No  Recommended Plan for Multiple Antipsychotic Therapies: NA   Allergies as of 11/04/2018   No Known Allergies     Medication List    TAKE these medications     Indication  benztropine 1 MG tablet Commonly known as:  COGENTIN Take 1 tablet (1 mg total) by mouth 2 (two) times daily.  Indication:  Extrapyramidal Reaction caused by Medications   haloperidol 20 MG tablet Commonly known as:  HALDOL Take 1 tablet (20 mg total) by mouth at bedtime.  Indication:   Hypomanic Episode of Bipolar Disorder   haloperidol decanoate 50 MG/ML injection Commonly known as:  HALDOL DECANOATE Inject 3 mLs (150 mg total) into the muscle every 28 (twenty-eight) days.  Indication:  Schizophrenia      Follow-up Information    [facility] Follow up on 11/13/2018.   Why:  Hospital follow up appointment is Wednesday, 4/29 at 2:30p.  The appointment will be held over the telephone. The provider will contact the patient.  Contact information: [address] [place] [address] [phone]         SignedMalvin Johns: Nannie Starzyk, MD 11/04/2018, 9:06 AM

## 2018-11-04 NOTE — Plan of Care (Addendum)
Patient denies SI HI AVH. Somewhat less irritable than before. Patient is compliant with medications. No side effects noted.  Problem: Education: Goal: Knowledge of the prescribed therapeutic regimen will improve Outcome: Progressing   Problem: Activity: Goal: Interest or engagement in leisure activities will improve Outcome: Progressing Goal: Imbalance in normal sleep/wake cycle will improve Outcome: Progressing   Problem: Coping: Goal: Coping ability will improve Outcome: Progressing

## 2018-11-04 NOTE — [facility] Group Notes (Signed)
[facility] LCSW Group Therapy Note  Date/Time: 11/04/18, 1315  Type of Therapy and Topic:  Group Therapy:  Overcoming Obstacles  Participation Level:  moderate  Description of Group:    In this group patients will be encouraged to explore what they see as obstacles to their own wellness and recovery. They will be guided to discuss their thoughts, feelings, and behaviors related to these obstacles. The group will process together ways to cope with barriers, with attention given to specific choices patients can make. Each patient will be challenged to identify changes they are motivated to make in order to overcome their obstacles. This group will be process-oriented, with patients participating in exploration of their own experiences as well as giving and receiving support and challenge from other group members.  Therapeutic Goals: 1. Patient will identify personal and current obstacles as they relate to admission. 2. Patient will identify barriers that currently interfere with their wellness or overcoming obstacles.  3. Patient will identify feelings, thought process and behaviors related to these barriers. 4. Patient will identify two changes they are willing to make to overcome these obstacles:    Summary of Patient Progress: Pt came to first group today just prior to discharge.  Pt acknowledged that mental health is an obstacle in his life but then followed that up by saying his long term goal presently is "to breathe."  Pt continues to make sarcastic comments but was attentive throughout the group discussion.       Therapeutic Modalities:   Cognitive Behavioral Therapy Solution Focused Therapy Motivational Interviewing Relapse Prevention Therapy  Daleen Squibb, LCSW

## 2018-11-04 NOTE — [facility] Suicide Risk Assessment (Signed)
[facility] Discharge Suicide Risk Assessment   Principal Problem: Schizophrenia, chronically undertreated Discharge Diagnoses: Active Problems:   Psychosis (HCC)  More conversant but making disjointed statements without acute thoughts of harm to self or others Total Time spent with patient: 45 minutes Mental Status Per Nursing Assessment::   On Admission:  NA  Demographic Factors:  Male and Low socioeconomic status  Loss Factors: Decrease in vocational status  Historical Factors: NA  Risk Reduction Factors:   Positive coping skills or problem solving skills  Continued Clinical Symptoms:  Schizophrenia:   Less than 69 years old Paranoid or undifferentiated type  Cognitive Features That Contribute To Risk:  Loss of executive function    Suicide Risk:  Minimal: No identifiable suicidal ideation.  Patients presenting with no risk factors but with morbid ruminations; may be classified as minimal risk based on the severity of the depressive symptoms  Follow-up Information    [facility] Follow up on 11/13/2018.   Why:  Hospital follow up appointment is Wednesday, 4/29 at 2:30p.  The appointment will be held over the telephone. The provider will contact the patient.  Contact information: [address] [place] [address] [phone]           Plan Of Care/Follow-up recommendations:  Activity:  full  Nakesha Ebrahim, MD 11/04/2018, 9:03 AM

## 2018-11-04 NOTE — Progress Notes (Signed)
  [facility] Adult Case Management Discharge Plan :  Will you be returning to the same living situation after discharge:  Yes,  pt is homeless At discharge, do you have transportation home?: No. No bus fare due to virus.  Will use bus. Do you have the ability to pay for your medications: Yes,  medicaid  Release of information consent forms completed and in the chart;  Patient's signature needed at discharge.  Patient to Follow up at: Follow-up Information    [facility] Follow up on 11/13/2018.   Why:  Hospital follow up appointment is Tuesday, 4/21 at 1:30p.   The appointment will be held over the telephone. The provider will contact the patient.  Contact information: [address] [place] [address] [phone]           Next level of care provider has access to [facility] Link:no  Safety Planning and Suicide Prevention discussed: No. former Malen Gauze mother cannot be ongoing support, limited contact.   Have you used any form of tobacco in the last 30 days? (Cigarettes, Smokeless Tobacco, Cigars, and/or Pipes): Patient Refused Screening  Has patient been referred to the Quitline?: Patient refused referral  Patient has been referred for addiction treatment: Yes, [facility]  Lorri Frederick, LCSW 11/04/2018, 11:35 AM

## 2018-11-04 NOTE — Progress Notes (Addendum)
CSW met with pt to discuss discharge.  Pt [place]town on the [place]."  Pt said his plan is to return and continue this after discharge.  CSW asked about connection with [facility] and pt said he is "not going back there."  Pt reports he has been to [facility] within the past few months.  We discussed phone calls in lieu of appts due to the virus situation.  Pt reports he does not have a phone at this time to do any phone calls.  CSW spoke with CSW assistant regarding any other options for connection to [facility] after discharge and she will ask for other options. Winferd Humphrey, MSW, LCSW Clinical Social Worker 11/04/2018 9:28 AM   Chyree Counts spoke to [facility] and they asked for some working phone number to contact pt--shelter, hotel.  They did not offer any other options if no phone contact is available.  CSW left message with contact at [facility] to ask for other options. Winferd Humphrey, MSW, LCSW Clinical Social Worker 11/04/2018 10:14 AM

## 2018-11-04 NOTE — Progress Notes (Signed)
Discharge note: Patient reviewed discharge paperwork with RN including prescriptions, follow up appointments, and lab work. Patient given the opportunity to ask questions. All concerns were addressed. All belongings were returned to patient. Denied SI/HI/AVH. Patient thanked staff for their care while at the hospital.  Patient was discharged to lobby. 

## 2018-11-04 NOTE — Progress Notes (Addendum)
Recreation Therapy Notes  Date: 4.20.20 Time: 1000 Location: 500 Hall Dayroom  Group Topic: Wellness  Goal Area(s) Addresses:  Patient will define components of whole wellness. Patient will verbalize benefit of whole wellness.  Behavioral Response: Engaged  Intervention:  Music   Activity:  Exercise.  LRT allowed each patient to lead the group in stretches.  Once stretches were completed, each patient led the group in an exercise of their choice.  Patients were to complete at least 30 minutes of exercise.  Patients were allowed to take breaks and get water as needed.  Education: Wellness, Building control surveyor.   Education Outcome: Acknowledges education/In group clarification offered/Needs additional education.   Clinical Observations/Feedback:  Pt was active and engaged.  Pt was smiling throughout activity.  Pt led group in arm circles, side lunges push ups and a yoga pose.  Pt was pleasant and attentive during group session.    Caroll Rancher, LRT/CTRS     Caroll Rancher A 11/04/2018 11:15 AM

## 2018-11-08 ENCOUNTER — Encounter (HOSPITAL_COMMUNITY): Payer: Self-pay | Admitting: Emergency Medicine

## 2018-11-08 ENCOUNTER — Emergency Department (HOSPITAL_COMMUNITY)
Admission: EM | Admit: 2018-11-08 | Discharge: 2018-11-08 | Disposition: A | Discharge status: Home / Self Care | Payer: No Typology Code available for payment source | Attending: Emergency Medicine | Admitting: Emergency Medicine

## 2018-11-08 ENCOUNTER — Other Ambulatory Visit: Payer: Self-pay

## 2018-11-08 DIAGNOSIS — Z79899 Other long term (current) drug therapy: Secondary | ICD-10-CM | POA: Insufficient documentation

## 2018-11-08 DIAGNOSIS — F419 Anxiety disorder, unspecified: Secondary | ICD-10-CM

## 2018-11-08 DIAGNOSIS — F329 Major depressive disorder, single episode, unspecified: Secondary | ICD-10-CM | POA: Insufficient documentation

## 2018-11-08 DIAGNOSIS — F17210 Nicotine dependence, cigarettes, uncomplicated: Secondary | ICD-10-CM | POA: Insufficient documentation

## 2018-11-08 DIAGNOSIS — F909 Attention-deficit hyperactivity disorder, unspecified type: Secondary | ICD-10-CM | POA: Diagnosis not present

## 2018-11-08 HISTORY — DX: Other schizoaffective disorders: F25.8

## 2018-11-08 LAB — COMPREHENSIVE METABOLIC PANEL
ALT: 21 U/L (ref 0–44)
AST: 19 U/L (ref 15–41)
Albumin: 4 g/dL (ref 3.5–5.0)
Alkaline Phosphatase: 65 U/L (ref 38–126)
Anion gap: 11 (ref 5–15)
BUN: 13 mg/dL (ref 6–20)
CO2: 28 mmol/L (ref 22–32)
Calcium: 9.6 mg/dL (ref 8.9–10.3)
Chloride: 101 mmol/L (ref 98–111)
Creatinine, Ser: 0.92 mg/dL (ref 0.61–1.24)
GFR calc Af Amer: >60 mL/min (ref >60)
GFR calc non Af Amer: >60 mL/min (ref >60)
Glucose, Bld: 102 mg/dL — ABNORMAL HIGH (ref 70–99)
Potassium: 4.8 mmol/L (ref 3.5–5.1)
Sodium: 140 mmol/L (ref 135–145)
Total Bilirubin: 0.4 mg/dL (ref 0.3–1.2)
Total Protein: 7.2 g/dL (ref 6.5–8.1)

## 2018-11-08 LAB — ETHANOL: Alcohol, Ethyl (B): <10 mg/dL (ref <10)

## 2018-11-08 LAB — CBC
HCT: 43 % (ref 39.0–52.0)
Hemoglobin: 14.7 g/dL (ref 13.0–17.0)
MCH: 30.5 pg (ref 26.0–34.0)
MCHC: 34.2 g/dL (ref 30.0–36.0)
MCV: 89.2 fL (ref 80.0–100.0)
Platelets: 285 10*3/uL (ref 150–400)
RBC: 4.82 MIL/uL (ref 4.22–5.81)
RDW: 12.2 % (ref 11.5–15.5)
WBC: 10.6 10*3/uL — ABNORMAL HIGH (ref 4.0–10.5)
nRBC: 0 % (ref 0.0–0.2)

## 2018-11-08 LAB — RAPID URINE DRUG SCREEN, HOSP PERFORMED
Amphetamines: NOT DETECTED
Barbiturates: NOT DETECTED
Benzodiazepines: NOT DETECTED
Cocaine: NOT DETECTED
Opiates: NOT DETECTED
Tetrahydrocannabinol: NOT DETECTED

## 2018-11-08 MED ORDER — ZOLPIDEM TARTRATE 5 MG PO TABS: 5.0000 mg | ORAL_TABLET | Freq: Every evening | ORAL | Status: DC | PRN

## 2018-11-08 MED ORDER — NICOTINE 21 MG/24HR TD PT24: 21.0000 mg | MEDICATED_PATCH | Freq: Every day | TRANSDERMAL | Status: DC

## 2018-11-08 MED ORDER — ACETAMINOPHEN 325 MG PO TABS: 650.0000 mg | ORAL_TABLET | ORAL | Status: DC | PRN

## 2018-11-08 NOTE — ED Notes (Signed)
Pt discharged home. Discharged instructions read to pt who verbalized understanding. All belongings returned to pt who signed for same. Denies SI/HI, is not delusional and not responding to internal stimuli. Escorted pt to the ED exit.    

## 2018-11-08 NOTE — ED Triage Notes (Signed)
Pt here with c/o feeling anxious and stating "my head is fighting himself" x2 weeks.  Pt also states he is homeless and has not been sleeping. Pt has a history bipolar and schizophrenia - does not take any medication.  Patient denies SI/HI.    EMS vitals:  BP 168/88 HR 80  RR 16 Temp 97.3 Sp02 97%

## 2018-11-08 NOTE — Discharge Instructions (Addendum)
Follow up with outpatient resources within 3 days. Return to the Er for new or worsening symptoms or any other concerns.

## 2018-11-08 NOTE — ED Notes (Signed)
[facility] is calm and cooperative. Denies SI/HI, but has been having some hallucinations or feeling strange in his head on and off.

## 2018-11-08 NOTE — ED Notes (Addendum)
Pt [facility]

## 2018-11-08 NOTE — ED Provider Notes (Signed)
[facility] EMERGENCY DEPARTMENT Provider Note   CSN: [id number] Arrival date & time: 11/08/18  9758    History   Chief Complaint Chief Complaint  Patient presents with  . Anxious    HPI Dalton Rosario is a 23 y.o. male with ADHD, anxiety, depression, PTSD, tobacco use, marijuana use, and prior pneumomediastinum who presents to the ED with complaints of anxiety & racing thoughts. Patient states he has had these sxs intermittently x 2 weeks, he notes it seems worse over the past 24-48 hours. He states he feels his thoughts are racing, he cannot focus, and always feels the need to be doing something leading to poor sleeping. This has lead him to feel anxious. He expresses that he is having auditory/visual hallucinations, but does not really elaborate on this states it is "in general." No specific alleviating/aggravating factors. Denies SI/HI. Denies EtOH use. Admits to marijuana use- most recently prior to hospital admission.   Of note he had recent medical admission 04/11- 04/13 for pneumomediastinum. Chart reviewed- admitted to medical ward, placed on supplemental O2 via [place], oxygen remained stable, follow of CXR showed improvement. Cleared by CT surgery w/ recommendation to continued conservative management. Patient denies chest pain, dyspnea, cough, vomiting, or fever. After discharge he was transferred to [facility] facility, received 150 mg IM Haloperidol w/ improvement & was discharged. He believes he was given prescriptions to fill, but has lost theses and was unable to fill the prescriptions.      HPI  Past Medical History:  Diagnosis Date  . ADHD (attention deficit hyperactivity disorder)   . Anxiety   . Asthma   . Depression     Patient Active Problem List   Diagnosis Date Noted  . Psychosis (HCC) 10/29/2018  . Acute psychosis (HCC)   . Pneumomediastinum (HCC) 10/26/2018  . Toxic encephalopathy 10/26/2018  . Hypokalemia 10/26/2018  . MDD (major depressive  disorder), recurrent episode, moderate (HCC) 06/20/2012  . PTSD (post-traumatic stress disorder) 06/20/2012  . 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 APPENDECTOMY     Pt was 23 yo        Home Medications    Prior to Admission medications   Medication Sig Start Date End Date Taking? Authorizing Provider  benztropine (COGENTIN) 1 MG tablet Take 1 tablet (1 mg total) by mouth 2 (two) times daily. 11/04/18   Malvin Johns, MD  fluticasone (FLONASE) 50 MCG/ACT nasal spray Place 2 sprays into both nostrils daily. 11/04/18   Malvin Johns, MD  haloperidol (HALDOL) 20 MG tablet Take 1 tablet (20 mg total) by mouth at bedtime. 11/04/18 11/04/19  Malvin Johns, MD  haloperidol decanoate (HALDOL DECANOATE) 50 MG/ML injection Inject 3 mLs (150 mg total) into the muscle every 28 (twenty-eight) days. 11/04/18   Malvin Johns, MD    Family History No family history on file.  Social History Social History   Tobacco Use  . Smoking status: Current Every Day Smoker    Packs/day: 0.50    Types: Cigarettes  . Smokeless tobacco: Never Used  Substance Use Topics  . Alcohol use: No  . Drug use: No     Allergies   Patient has no known allergies.   Review of Systems Review of Systems  Constitutional: Negative for chills and fever.  Respiratory: Negative for shortness of breath and stridor.   Cardiovascular: Negative for chest pain.  Gastrointestinal: Negative for nausea and vomiting.  Skin:  Negative for wound.  Psychiatric/Behavioral: Positive for hallucinations. Negative for suicidal ideas. The patient is nervous/anxious.   All other systems reviewed and are negative.   Physical Exam Updated Vital Signs BP 122/74   Pulse 79   Temp 97.8 F (36.6 C) (Oral)   Resp 16   Ht 5\' 7"  (1.702 m)   Wt 90.7 kg   SpO2 98%   BMI 31.32 kg/m   Physical Exam Vitals signs and nursing note reviewed.   Constitutional:      General: He is not in acute distress.    Appearance: He is well-developed. He is not toxic-appearing.  HENT:     Head: Normocephalic and atraumatic.  Eyes:     General:        Right eye: No discharge.        Left eye: No discharge.     Conjunctiva/sclera: Conjunctivae normal.  Neck:     Musculoskeletal: Neck supple. No neck rigidity, crepitus or pain with movement.  Cardiovascular:     Rate and Rhythm: Normal rate and regular rhythm.     Heart sounds: Normal heart sounds. No friction rub.  Pulmonary:     Effort: Pulmonary effort is normal. No respiratory distress.     Breath sounds: Normal breath sounds. No stridor. No wheezing, rhonchi or rales.  Chest:     Chest wall: No tenderness or crepitus.  Abdominal:     General: There is no distension.     Palpations: Abdomen is soft.     Tenderness: There is no abdominal tenderness.  Skin:    General: Skin is warm and dry.     Findings: No rash.  Neurological:     Mental Status: He is alert.     Comments: Clear speech.   Psychiatric:        Mood and Affect: Mood is anxious.        Thought Content: Thought content does not include homicidal or suicidal ideation.    ED Treatments / Results  Labs (all labs ordered are listed, but only abnormal results are displayed) Labs Reviewed - No data to display  EKG None  Radiology No results found.  Procedures Procedures (including critical care time)  Medications Ordered in ED Medications - No data to display   Initial Impression / Assessment and Plan / ED Course  I have reviewed the triage vital signs and the nursing notes.  Pertinent labs & imaging results that were available during my care of the patient were reviewed by me and considered in my medical decision making (see chart for details).   Patient presents to the ED with anxiety & racing thoughts. Nontoxic appearing, no apparent distress, vitals WNL. Recent admission for pneumomediastinum  thoroughly reviewed, CT surgery signed off w/ improving CXR, no surgical intervention required. Today patient does not appear septic, his vital signs are normal with no fever, tachycardia, or hypoxia. He has not palpable crepitus. His heart & lung sounds are normal. Does not appear to need further evaluation or intervention for hx of pneumomediastinum at this time. Will obtain screening labs & consult TTS. Minimal leukocytosis felt to be nonspecific, otherwise unremarkable.   Patient medically cleared. [facility]   [facility] Outpatient resources provided.     Final Clinical Impressions(s) / ED Diagnoses   Final diagnoses:  Anxiety    ED Discharge Orders    None       Cherly Andersonetrucelli, Faithlyn Recktenwald R, PA-C 11/08/18 1052  Eber Hong, MD 11/09/18 [phone]

## 2018-11-08 NOTE — ED Notes (Signed)
Pt aware we need a urine specimen. 
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
## 2018-11-08 NOTE — ED Notes (Signed)
Pt valuables inventoried and given to security.

## 2018-11-08 NOTE — ED Notes (Signed)
Lunch tray ordered 

## 2018-11-08 NOTE — BH Assessment (Signed)
[facility] Assessment Progress Note Jeannine Kitten MD was consulted this date to review case with Jeannine Kitten reporting patient did not meet inpatient criteria. Starks FNP also interview patient and agreed to discharge

## 2018-11-12 ENCOUNTER — Other Ambulatory Visit: Payer: Self-pay

## 2018-11-12 ENCOUNTER — Encounter (HOSPITAL_COMMUNITY): Payer: Self-pay

## 2018-11-12 ENCOUNTER — Emergency Department (HOSPITAL_COMMUNITY)
Admission: EM | Admit: 2018-11-12 | Discharge: 2018-11-12 | Disposition: A | Discharge status: Home / Self Care | Payer: Medicaid Other | Attending: Emergency Medicine | Admitting: Emergency Medicine

## 2018-11-12 ENCOUNTER — Emergency Department (HOSPITAL_COMMUNITY): Payer: Medicaid Other

## 2018-11-12 DIAGNOSIS — Z5900 Homelessness unspecified: Secondary | ICD-10-CM

## 2018-11-12 DIAGNOSIS — F1910 Other psychoactive substance abuse, uncomplicated: Secondary | ICD-10-CM | POA: Insufficient documentation

## 2018-11-12 DIAGNOSIS — J45909 Unspecified asthma, uncomplicated: Secondary | ICD-10-CM | POA: Diagnosis not present

## 2018-11-12 DIAGNOSIS — R251 Tremor, unspecified: Secondary | ICD-10-CM | POA: Insufficient documentation

## 2018-11-12 DIAGNOSIS — F259 Schizoaffective disorder, unspecified: Secondary | ICD-10-CM | POA: Insufficient documentation

## 2018-11-12 DIAGNOSIS — F17210 Nicotine dependence, cigarettes, uncomplicated: Secondary | ICD-10-CM | POA: Diagnosis not present

## 2018-11-12 DIAGNOSIS — Z79899 Other long term (current) drug therapy: Secondary | ICD-10-CM | POA: Diagnosis not present

## 2018-11-12 LAB — CBC WITH DIFFERENTIAL/PLATELET
Abs Immature Granulocytes: 0.03 10*3/uL (ref 0.00–0.07)
Basophils Absolute: 0 10*3/uL (ref 0.0–0.1)
Basophils Relative: 0 %
Eosinophils Absolute: 0 10*3/uL (ref 0.0–0.5)
Eosinophils Relative: 0 %
HCT: 41.3 % (ref 39.0–52.0)
Hemoglobin: 14.4 g/dL (ref 13.0–17.0)
Immature Granulocytes: 0 %
Lymphocytes Relative: 11 %
Lymphs Abs: 1.3 10*3/uL (ref 0.7–4.0)
MCH: 31.1 pg (ref 26.0–34.0)
MCHC: 34.9 g/dL (ref 30.0–36.0)
MCV: 89.2 fL (ref 80.0–100.0)
Monocytes Absolute: 0.5 10*3/uL (ref 0.1–1.0)
Monocytes Relative: 4 %
Neutro Abs: 10.1 10*3/uL — ABNORMAL HIGH (ref 1.7–7.7)
Neutrophils Relative %: 85 %
Platelets: 259 10*3/uL (ref 150–400)
RBC: 4.63 MIL/uL (ref 4.22–5.81)
RDW: 12.5 % (ref 11.5–15.5)
WBC: 12 10*3/uL — ABNORMAL HIGH (ref 4.0–10.5)
nRBC: 0 % (ref 0.0–0.2)

## 2018-11-12 LAB — RAPID URINE DRUG SCREEN, HOSP PERFORMED
Amphetamines: NOT DETECTED
Barbiturates: NOT DETECTED
Benzodiazepines: NOT DETECTED
Cocaine: NOT DETECTED
Opiates: NOT DETECTED
Tetrahydrocannabinol: POSITIVE — AB

## 2018-11-12 LAB — COMPREHENSIVE METABOLIC PANEL
ALT: 20 U/L (ref 0–44)
AST: 20 U/L (ref 15–41)
Albumin: 4.3 g/dL (ref 3.5–5.0)
Alkaline Phosphatase: 59 U/L (ref 38–126)
Anion gap: 8 (ref 5–15)
BUN: 10 mg/dL (ref 6–20)
CO2: 25 mmol/L (ref 22–32)
Calcium: 9.3 mg/dL (ref 8.9–10.3)
Chloride: 103 mmol/L (ref 98–111)
Creatinine, Ser: 0.82 mg/dL (ref 0.61–1.24)
GFR calc Af Amer: >60 mL/min (ref >60)
GFR calc non Af Amer: >60 mL/min (ref >60)
Glucose, Bld: 116 mg/dL — ABNORMAL HIGH (ref 70–99)
Potassium: 3.7 mmol/L (ref 3.5–5.1)
Sodium: 136 mmol/L (ref 135–145)
Total Bilirubin: 0.7 mg/dL (ref 0.3–1.2)
Total Protein: 7.6 g/dL (ref 6.5–8.1)

## 2018-11-12 LAB — URINALYSIS, ROUTINE W REFLEX MICROSCOPIC
Bilirubin Urine: NEGATIVE
Glucose, UA: NEGATIVE mg/dL
Hgb urine dipstick: NEGATIVE
Ketones, ur: NEGATIVE mg/dL
Leukocytes,Ua: NEGATIVE
Nitrite: NEGATIVE
Protein, ur: NEGATIVE mg/dL
Specific Gravity, Urine: 1.014 (ref 1.005–1.030)
pH: 6 (ref 5.0–8.0)

## 2018-11-12 LAB — SALICYLATE LEVEL: Salicylate Lvl: <7 mg/dL (ref 2.8–30.0)

## 2018-11-12 LAB — ETHANOL: Alcohol, Ethyl (B): <10 mg/dL (ref <10)

## 2018-11-12 LAB — ACETAMINOPHEN LEVEL: Acetaminophen (Tylenol), Serum: <10 ug/mL — ABNORMAL LOW (ref 10–30)

## 2018-11-12 MED ORDER — LORAZEPAM 1 MG PO TABS
1.0000 mg | ORAL_TABLET | Freq: Once | ORAL | Status: AC
  Administered 2018-11-12: 1 mg via ORAL
  Filled 2018-11-12: qty 1

## 2018-11-12 NOTE — ED Triage Notes (Signed)
EMS reports coming from [employer], (homeless). Pt c/o of leg tremors, possible drug use. [employer] stated Pt has hx of drug use. Pt stated he "possibly" used Ecstasy.

## 2018-11-12 NOTE — ED Notes (Signed)
Bed: WA09 Expected date: 11/12/18 Expected time: 9:05 AM Means of arrival: Ambulance Comments: EMS-leg tremors, drug use

## 2018-11-12 NOTE — ED Provider Notes (Signed)
[facility]-EMERGENCY DEPT Provider Note   CSN: [id number] Arrival date & time: 11/12/18  7948    History   Chief Complaint Chief Complaint  Patient presents with  . Leg Tremors    HPI Chesky Respress is a 23 y.o. male.     Pt presents to the ED today with tremors.  He said they have been going on for about 1 week.  He was seen in the ED last on 4/24 with anxiety.  He was last at [facility] from 4/14-4/20.  He is chronically homeless and has schizophrenia.  He was given a dose of Haldol while there which will last 28 days.  He was given a rx for haldol and cogentin which he has not filled.  Pt is supposed to have an appt tomorrow with [facility] at 2:30.  He admits to using MJ and Ecstasy.  He was admitted from 4/11-4/14 with a spontaneous right pneumothorax complicated by pneumomediastinum.  It resolved with oxygen.  He denies sob.          Past Medical History:  Diagnosis Date  . ADHD (attention deficit hyperactivity disorder)   . Anxiety   . Asthma   . Depression   . Schizo-affective schizophrenia, chronic condition (HCC) 2019    Patient Active Problem List   Diagnosis Date Noted  . Psychosis (HCC) 10/29/2018  . Acute psychosis (HCC)   . Pneumomediastinum (HCC) 10/26/2018  . Toxic encephalopathy 10/26/2018  . Hypokalemia 10/26/2018  . MDD (major depressive disorder), recurrent episode, moderate (HCC) 06/20/2012  . PTSD (post-traumatic stress disorder) 06/20/2012  . 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 APPENDECTOMY     Pt was 23 yo        Home Medications    Prior to Admission medications   Medication Sig Start Date End Date Taking? Authorizing Provider  benztropine (COGENTIN) 1 MG tablet Take 1 tablet (1 mg total) by mouth 2 (two) times daily. 11/04/18   Malvin Johns, MD  fluticasone (FLONASE) 50 MCG/ACT nasal spray Place 2 sprays into both nostrils  daily. 11/04/18   Malvin Johns, MD  haloperidol (HALDOL) 20 MG tablet Take 1 tablet (20 mg total) by mouth at bedtime. 11/04/18 11/04/19  Malvin Johns, MD  haloperidol decanoate (HALDOL DECANOATE) 50 MG/ML injection Inject 3 mLs (150 mg total) into the muscle every 28 (twenty-eight) days. 11/04/18   Malvin Johns, MD    Family History History reviewed. No pertinent family history.  Social History Social History   Tobacco Use  . Smoking status: Current Every Day Smoker    Packs/day: 0.50    Types: Cigarettes  . Smokeless tobacco: Never Used  Substance Use Topics  . Alcohol use: No  . Drug use: No     Allergies   Patient has no known allergies.   Review of Systems Review of Systems  Neurological: Positive for tremors.  All other systems reviewed and are negative.    Physical Exam Updated Vital Signs BP (!) 146/98   Pulse 90   Temp 97.9 F (36.6 C)   Resp 16   SpO2 99%   Physical Exam Vitals signs and nursing note reviewed.  Constitutional:      Appearance: Normal appearance.  HENT:     Head: Normocephalic and atraumatic.     Right Ear: External ear normal.     Left Ear: External ear normal.     Nose: Nose normal.  Mouth/Throat:     Mouth: Mucous membranes are moist.  Eyes:     Extraocular Movements: Extraocular movements intact.     Pupils: Pupils are equal, round, and reactive to light.  Neck:     Musculoskeletal: Normal range of motion and neck supple.  Cardiovascular:     Rate and Rhythm: Normal rate and regular rhythm.     Pulses: Normal pulses.     Heart sounds: Normal heart sounds.  Pulmonary:     Effort: Pulmonary effort is normal.     Breath sounds: Normal breath sounds.  Abdominal:     General: Abdomen is flat.  Musculoskeletal: Normal range of motion.  Skin:    General: Skin is warm.     Capillary Refill: Capillary refill takes less than 2 seconds.  Neurological:     General: No focal deficit present.     Mental Status: He is alert and  oriented to person, place, and time.  Psychiatric:        Thought Content: Thought content is paranoid.      ED Treatments / Results  Labs (all labs ordered are listed, but only abnormal results are displayed) Labs Reviewed  COMPREHENSIVE METABOLIC PANEL - Abnormal; Notable for the following components:      Result Value   Glucose, Bld 116 (*)    All other components within normal limits  RAPID URINE DRUG SCREEN, HOSP PERFORMED - Abnormal; Notable for the following components:   Tetrahydrocannabinol POSITIVE (*)    All other components within normal limits  CBC WITH DIFFERENTIAL/PLATELET - Abnormal; Notable for the following components:   WBC 12.0 (*)    Neutro Abs 10.1 (*)    All other components within normal limits  ACETAMINOPHEN LEVEL - Abnormal; Notable for the following components:   Acetaminophen (Tylenol), Serum <10 (*)    All other components within normal limits  ETHANOL  SALICYLATE LEVEL  URINALYSIS, ROUTINE W REFLEX MICROSCOPIC    EKG None  Radiology Dg Chest Portable 1 View  Result Date: 11/12/2018 CLINICAL DATA:  Pneumomediastinum. EXAM: PORTABLE CHEST 1 VIEW COMPARISON:  None. FINDINGS: The heart size is normal. No residual pneumomediastinum is present. Previously seen subcutaneous emphysema has resolved as well. The lungs are clear. There is no edema or effusion. IMPRESSION: 1. Interval resolution of pneumomediastinum and subcutaneous emphysema. 2. Normal one-view chest x-ray Electronically Signed   By: Marin Robertshristopher  Mattern M.D.   On: 11/12/2018 09:55    Procedures Procedures (including critical care time)  Medications Ordered in ED Medications  LORazepam (ATIVAN) tablet 1 mg (1 mg Oral Given 11/12/18 0925)     Initial Impression / Assessment and Plan / ED Course  I have reviewed the triage vital signs and the nursing notes.  Pertinent labs & imaging results that were available during my care of the patient were reviewed by me and considered in my  medical decision making (see chart for details).     Pt is not suicidal or homicidal.  He does not want a prescription because he lost his ID and can't pick it up at the pharmacy.  [facility]  They should be able to help him with his meds.  He did not know he had this appointment, but I told him to go there tomorrow at 2:30 pm.  He is also encouraged to not use ecstasy and MJ.  Final Clinical Impressions(s) / ED Diagnoses   Final diagnoses:  Occasional tremors  Polysubstance abuse (HCC)  Homeless  Schizoaffective disorder, unspecified type [facility])    ED Discharge Orders    None       Jacalyn Lefevre, MD 11/12/18 1042

## 2018-11-15 ENCOUNTER — Observation Stay (HOSPITAL_COMMUNITY)
Admission: RE | Admit: 2018-11-15 | Discharge: 2018-11-16 | Disposition: A | Discharge status: Home / Self Care | Payer: No Typology Code available for payment source | Attending: Psychiatry | Admitting: Psychiatry

## 2018-11-15 ENCOUNTER — Other Ambulatory Visit: Payer: Self-pay

## 2018-11-15 ENCOUNTER — Emergency Department (HOSPITAL_COMMUNITY)
Admission: EM | Admit: 2018-11-15 | Discharge: 2018-11-15 | Disposition: A | Discharge status: Home / Self Care | Payer: Medicaid Other | Attending: Emergency Medicine | Admitting: Emergency Medicine

## 2018-11-15 ENCOUNTER — Emergency Department (HOSPITAL_COMMUNITY): Payer: Medicaid Other

## 2018-11-15 ENCOUNTER — Encounter (HOSPITAL_COMMUNITY): Payer: Self-pay

## 2018-11-15 ENCOUNTER — Encounter (HOSPITAL_COMMUNITY): Payer: Self-pay | Admitting: Emergency Medicine

## 2018-11-15 DIAGNOSIS — F2089 Other schizophrenia: Secondary | ICD-10-CM | POA: Insufficient documentation

## 2018-11-15 DIAGNOSIS — F909 Attention-deficit hyperactivity disorder, unspecified type: Secondary | ICD-10-CM | POA: Insufficient documentation

## 2018-11-15 DIAGNOSIS — R45851 Suicidal ideations: Secondary | ICD-10-CM | POA: Insufficient documentation

## 2018-11-15 DIAGNOSIS — F4310 Post-traumatic stress disorder, unspecified: Secondary | ICD-10-CM | POA: Diagnosis present

## 2018-11-15 DIAGNOSIS — F319 Bipolar disorder, unspecified: Secondary | ICD-10-CM | POA: Diagnosis not present

## 2018-11-15 DIAGNOSIS — Z7951 Long term (current) use of inhaled steroids: Secondary | ICD-10-CM | POA: Insufficient documentation

## 2018-11-15 DIAGNOSIS — F17210 Nicotine dependence, cigarettes, uncomplicated: Secondary | ICD-10-CM | POA: Insufficient documentation

## 2018-11-15 DIAGNOSIS — J45909 Unspecified asthma, uncomplicated: Secondary | ICD-10-CM | POA: Insufficient documentation

## 2018-11-15 DIAGNOSIS — Z590 Homelessness: Secondary | ICD-10-CM | POA: Insufficient documentation

## 2018-11-15 DIAGNOSIS — R5383 Other fatigue: Secondary | ICD-10-CM | POA: Insufficient documentation

## 2018-11-15 DIAGNOSIS — Z79899 Other long term (current) drug therapy: Secondary | ICD-10-CM | POA: Diagnosis not present

## 2018-11-15 DIAGNOSIS — F23 Brief psychotic disorder: Secondary | ICD-10-CM | POA: Diagnosis present

## 2018-11-15 DIAGNOSIS — R531 Weakness: Secondary | ICD-10-CM | POA: Insufficient documentation

## 2018-11-15 LAB — URINALYSIS, ROUTINE W REFLEX MICROSCOPIC
Bilirubin Urine: NEGATIVE
Glucose, UA: NEGATIVE mg/dL
Hgb urine dipstick: NEGATIVE
Ketones, ur: NEGATIVE mg/dL
Leukocytes,Ua: NEGATIVE
Nitrite: NEGATIVE
Protein, ur: NEGATIVE mg/dL
Specific Gravity, Urine: 1.002 — ABNORMAL LOW (ref 1.005–1.030)
pH: 7 (ref 5.0–8.0)

## 2018-11-15 LAB — CBC WITH DIFFERENTIAL/PLATELET
Abs Immature Granulocytes: 0.03 10*3/uL (ref 0.00–0.07)
Basophils Absolute: 0 10*3/uL (ref 0.0–0.1)
Basophils Relative: 0 %
Eosinophils Absolute: 0.1 10*3/uL (ref 0.0–0.5)
Eosinophils Relative: 0 %
HCT: 39.2 % (ref 39.0–52.0)
Hemoglobin: 13.5 g/dL (ref 13.0–17.0)
Immature Granulocytes: 0 %
Lymphocytes Relative: 20 %
Lymphs Abs: 2.5 10*3/uL (ref 0.7–4.0)
MCH: 31.4 pg (ref 26.0–34.0)
MCHC: 34.4 g/dL (ref 30.0–36.0)
MCV: 91.2 fL (ref 80.0–100.0)
Monocytes Absolute: 0.7 10*3/uL (ref 0.1–1.0)
Monocytes Relative: 5 %
Neutro Abs: 9.3 10*3/uL — ABNORMAL HIGH (ref 1.7–7.7)
Neutrophils Relative %: 75 %
Platelets: 209 10*3/uL (ref 150–400)
RBC: 4.3 MIL/uL (ref 4.22–5.81)
RDW: 12.6 % (ref 11.5–15.5)
WBC: 12.6 10*3/uL — ABNORMAL HIGH (ref 4.0–10.5)
nRBC: 0 % (ref 0.0–0.2)

## 2018-11-15 LAB — BASIC METABOLIC PANEL
Anion gap: 6 (ref 5–15)
BUN: 11 mg/dL (ref 6–20)
CO2: 27 mmol/L (ref 22–32)
Calcium: 8.5 mg/dL — ABNORMAL LOW (ref 8.9–10.3)
Chloride: 107 mmol/L (ref 98–111)
Creatinine, Ser: 0.79 mg/dL (ref 0.61–1.24)
GFR calc Af Amer: >60 mL/min (ref >60)
GFR calc non Af Amer: >60 mL/min (ref >60)
Glucose, Bld: 96 mg/dL (ref 70–99)
Potassium: 3.6 mmol/L (ref 3.5–5.1)
Sodium: 140 mmol/L (ref 135–145)

## 2018-11-15 MED ORDER — HALOPERIDOL DECANOATE 100 MG/ML IM SOLN: 150.0000 mg | INTRAMUSCULAR | Status: DC

## 2018-11-15 MED ORDER — BENZTROPINE MESYLATE 1 MG PO TABS
1.0000 mg | ORAL_TABLET | Freq: Two times a day (BID) | ORAL | Status: DC
  Administered 2018-11-16: 1 mg via ORAL
  Filled 2018-11-15: qty 1

## 2018-11-15 MED ORDER — DIPHENHYDRAMINE HCL 25 MG PO CAPS
25.0000 mg | ORAL_CAPSULE | Freq: Four times a day (QID) | ORAL | Status: DC | PRN
  Filled 2018-11-15: qty 1

## 2018-11-15 MED ORDER — ACETAMINOPHEN 325 MG PO TABS
650.0000 mg | ORAL_TABLET | Freq: Once | ORAL | Status: AC
  Administered 2018-11-15: 650 mg via ORAL
  Filled 2018-11-15: qty 2

## 2018-11-15 MED ORDER — FLUTICASONE PROPIONATE 50 MCG/ACT NA SUSP
2.0000 | Freq: Every day | NASAL | Status: DC
  Administered 2018-11-16: 2 via NASAL
  Filled 2018-11-15: qty 16

## 2018-11-15 MED ORDER — SODIUM CHLORIDE 0.9 % IV BOLUS
1000.0000 mL | Freq: Once | INTRAVENOUS | Status: AC
  Administered 2018-11-15: 1000 mL via INTRAVENOUS

## 2018-11-15 MED ORDER — HALOPERIDOL 5 MG PO TABS
20.0000 mg | ORAL_TABLET | Freq: Every day | ORAL | Status: DC
  Administered 2018-11-15: 23:00:00 20 mg via ORAL
  Filled 2018-11-15: qty 4
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
## 2018-11-15 NOTE — ED Notes (Signed)
Pt ambulated out to nurse's station and threatened to fall, stating "I don't want to leave!"  Pt then was asked to return to room and would not until this writer took him back to his room and visualized him get into bed.

## 2018-11-15 NOTE — BH Assessment (Signed)
[facility] Assessment Progress Note Case was staffed with Money NP who recommended patient be observed and monitored for safety.    

## 2018-11-15 NOTE — ED Notes (Signed)
Pt asked to be admitted to the psych ward because "I don't want to feel this way anymore."  Pt told he was medically cleared, so we cannot admit him here, but he can walk across the street to behavorial health and be evaluated.

## 2018-11-15 NOTE — Discharge Instructions (Addendum)
Stay well hydrated. Eat a snack or meal every 4 hours. See resources attached.   Return for fever, cough, chest pain, shortness of breath with exercise, vomiting, diarrhea, blood in stools

## 2018-11-15 NOTE — H&P (Signed)
Behavioral Health Medical Screening Exam  Dalton Rosario is an 23 y.o. male.  Total Time spent with patient: 20 minutes  Psychiatric Specialty Exam: Physical Exam  Nursing note and vitals reviewed. Constitutional: He is oriented to person, place, and time. He appears well-developed and well-nourished.  Cardiovascular: Normal rate.  Respiratory: Effort normal.  Musculoskeletal: Normal range of motion.  Neurological: He is alert and oriented to person, place, and time.  Skin: Skin is warm.    Review of Systems  Constitutional: Negative.   HENT: Negative.   Eyes: Negative.   Respiratory: Negative.   Cardiovascular: Negative.   Gastrointestinal: Negative.   Genitourinary: Negative.   Musculoskeletal: Negative.   Skin: Negative.   Neurological: Negative.   Endo/Heme/Allergies: Negative.   Psychiatric/Behavioral: Positive for depression, hallucinations, substance abuse and suicidal ideas. The patient is nervous/anxious.     Blood pressure 140/90, pulse 89, temperature 98.1 F (36.7 C), temperature source Oral, resp. rate 18, SpO2 98 %.There is no height or weight on file to calculate BMI.  General Appearance: Bizarre and Disheveled  Eye Contact:  Fair  Speech:  Blocked  Volume:  Decreased  Mood:  Anxious, Depressed and Hopeless  Affect:  Flat  Thought Process:  Linear and Descriptions of Associations: Circumstantial  Orientation:  Full (Time, Place, and Person)  Thought Content:  Hallucinations: Visual  Suicidal Thoughts:  Yes.  without intent/plan  Homicidal Thoughts:  No  Memory:  Immediate;   Fair Recent;   Fair Remote;   Fair  Judgement:  Impaired  Insight:  Lacking  Psychomotor Activity:  Normal  Concentration: Concentration: Fair  Recall:  Good  Fund of Knowledge:Fair  Language: Fair  Akathisia:  No  Handed:  Right  AIMS (if indicated):     Assets:  Communication Skills Desire for Improvement Financial Resources/Insurance Physical Health  Sleep:        Musculoskeletal: Strength & Muscle Tone: within normal limits Gait & Station: normal Patient leans: N/A  Blood pressure 140/90, pulse 89, temperature 98.1 F (36.7 C), temperature source Oral, resp. rate 18, SpO2 98 %.  Recommendations:  Based on my evaluation the patient does not appear to have an emergency medical condition.  Dalton Burdock Unique Searfoss, FNP 11/15/2018, 6:44 PM

## 2018-11-15 NOTE — H&P (Signed)
Psychiatric Admission Assessment Adult  Patient Identification: Dalton Rosario MRN:  [id number] Date of Evaluation:  11/15/2018 Chief Complaint:  SI Principal Diagnosis: Acute psychosis (HCC) Diagnosis:  Principal Problem:   Acute psychosis (HCC)  History of Present Illness: Patient presents as a walk-in reporting severe depression, suicidal ideations, homelessness, and unable to follow up with [facility]. [facility] and he was told that he was medically cleared and to walk across the street for treatment. The patient states that [facility], but they refused to let him come in and only be interviewed by phone. Patient states that he has hallucinations of seeing things vanishing and then reappearing. He also reports suicidal ideations, but has no plan. Patient has foul body odor and presents standing and rocking back and forth. [facility] 11[facility]04-18. Today marks his 4th time coming back to the hospital since his discharge.   Associated Signs/Symptoms: Depression Symptoms:  depressed mood, fatigue, feelings of worthlessness/guilt, hopelessness, suicidal thoughts without plan, loss of energy/fatigue, (Hypo) Manic Symptoms:  Hallucinations, Anxiety Symptoms:  Denies Psychotic Symptoms:  Hallucinations: Visual PTSD Symptoms: Denies Total Time spent with patient: 30 minutes  Past Psychiatric History: At age 23 required inpatient treatment for depression and suicidality was also diagnosed with PTSD  Is the patient at risk to self? Yes.    Has the patient been a risk to self in the past 6 months? Yes.    Has the patient been a risk to self within the distant past? Yes.    Is the patient a risk to others? No.  Has the patient been a risk to others in the past 6 months? No.  Has the patient been a risk to others within the distant past? No.   Prior Inpatient Therapy: Prior Inpatient Therapy: Yes Prior Therapy Dates:  2013 [facility] Reason for Treatment: MH issues Prior Outpatient Therapy: Prior Outpatient Therapy: No Does patient have an ACCT team?: No Does patient have Intensive In-House Services?  : No Does patient have [facility] services? : No Does patient have P4CC services?: No  Alcohol Screening:   Substance Abuse History in the last 12 months:  Yes.   Consequences of Substance Abuse: Medical Consequences:  reviewed' Legal Consequences:  reviewed Family Consequences:  reviewed Previous Psychotropic Medications: Yes  Psychological Evaluations: Yes  Past Medical History:  Past Medical History:  Diagnosis Date  . ADHD (attention deficit hyperactivity disorder)   . Anxiety   . Asthma   . Depression   . Schizo-affective schizophrenia, chronic condition (HCC) 2019    Past Surgical History:  Procedure Laterality Date  . APPENDECTOMY     Pt was 23 yo   Family History: No family history on file. Family Psychiatric  History: None reported Tobacco Screening:   Social History:  Social History   Substance and Sexual Activity  Alcohol Use No     Social History   Substance and Sexual Activity  Drug Use No    Additional Social History: Marital status: Single    Pain Medications: See MAR Prescriptions: See MAR Over the Counter: See MAR History of alcohol / drug use?: No history of alcohol / drug abuse Longest period of sobriety (when/how long): NA                    Allergies:  No Known Allergies Lab Results:  Results for orders placed or performed during the hospital encounter of  11/15/18 (from the past 48 hour(s))  Urinalysis, Routine w reflex microscopic     Status: Abnormal   Collection Time: 11/15/18  1:41 PM  Result Value Ref Range   Color, Urine STRAW (A) YELLOW   APPearance CLEAR CLEAR   Specific Gravity, Urine 1.002 (L) 1.005 - 1.030   pH 7.0 5.0 - 8.0   Glucose, UA NEGATIVE NEGATIVE mg/dL   Hgb urine dipstick NEGATIVE NEGATIVE    Bilirubin Urine NEGATIVE NEGATIVE   Ketones, ur NEGATIVE NEGATIVE mg/dL   Protein, ur NEGATIVE NEGATIVE mg/dL   Nitrite NEGATIVE NEGATIVE   Leukocytes,Ua NEGATIVE NEGATIVE    Comment: Performed at [facility], [address] [place], [place] [address]  Basic metabolic panel     Status: Abnormal   Collection Time: 11/15/18  3:00 PM  Result Value Ref Range   Sodium 140 135 - 145 mmol/L   Potassium 3.6 3.5 - 5.1 mmol/L   Chloride 107 98 - 111 mmol/L   CO2 27 22 - 32 mmol/L   Glucose, Bld 96 70 - 99 mg/dL   BUN 11 6 - 20 mg/dL   Creatinine, Ser 6.04 0.61 - 1.24 mg/dL   Calcium 8.5 (L) 8.9 - 10.3 mg/dL   GFR calc non Af Amer >60 >60 mL/min   GFR calc Af Amer >60 >60 mL/min   Anion gap 6 5 - 15    Comment: Performed at [facility], [address], [place], [place] [address]  CBC with Differential     Status: Abnormal   Collection Time: 11/15/18  3:00 PM  Result Value Ref Range   WBC 12.6 (H) 4.0 - 10.5 K/uL   RBC 4.30 4.22 - 5.81 MIL/uL   Hemoglobin 13.5 13.0 - 17.0 g/dL   HCT 11.9 14.7 - 82.9 %   MCV 91.2 80.0 - 100.0 fL   MCH 31.4 26.0 - 34.0 pg   MCHC 34.4 30.0 - 36.0 g/dL   RDW 56.2 13.0 - 86.5 %   Platelets 209 150 - 400 K/uL   nRBC 0.0 0.0 - 0.2 %   Neutrophils Relative % 75 %   Neutro Abs 9.3 (H) 1.7 - 7.7 K/uL   Lymphocytes Relative 20 %   Lymphs Abs 2.5 0.7 - 4.0 K/uL   Monocytes Relative 5 %   Monocytes Absolute 0.7 0.1 - 1.0 K/uL   Eosinophils Relative 0 %   Eosinophils Absolute 0.1 0.0 - 0.5 K/uL   Basophils Relative 0 %   Basophils Absolute 0.0 0.0 - 0.1 K/uL   Immature Granulocytes 0 %   Abs Immature Granulocytes 0.03 0.00 - 0.07 K/uL    Comment: Performed at [facility], [address], [place], [place] [address]    Blood Alcohol level:  Lab Results  Component Value Date   [facility] <10 11/12/2018   ETH <10 11/08/2018    Metabolic Disorder Labs:  Lab Results  Component Value Date   HGBA1C 5.4  06/21/2012   MPG 108 06/21/2012   No results found for: PROLACTIN Lab Results  Component Value Date   CHOL 201 (H) 06/21/2012   TRIG 207 (H) 06/21/2012   HDL 42 06/21/2012   CHOLHDL 4.8 06/21/2012   VLDL 41 (H) 06/21/2012   LDLCALC 118 (H) 06/21/2012    Current Medications: No current facility-administered medications for this encounter.    PTA Medications: Medications Prior to Admission  Medication Sig Dispense Refill Last Dose  . benztropine (COGENTIN) 1 MG tablet Take  1 tablet (1 mg total) by mouth 2 (two) times daily. 60 tablet 2   . diphenhydrAMINE (BENADRYL) 25 mg capsule Take 25 mg by mouth every 6 (six) hours as needed for allergies (pain/ache).   11/15/2018 at Unknown time  . fluticasone (FLONASE) 50 MCG/ACT nasal spray Place 2 sprays into both nostrils daily. 1 g 2   . haloperidol (HALDOL) 20 MG tablet Take 1 tablet (20 mg total) by mouth at bedtime. 30 tablet 2   . haloperidol decanoate (HALDOL DECANOATE) 50 MG/ML injection Inject 3 mLs (150 mg total) into the muscle every 28 (twenty-eight) days. 1 mL 11     Musculoskeletal: Strength & Muscle Tone: within normal limits Gait & Station: normal Patient leans: N/A  Psychiatric Specialty Exam: Physical Exam  Nursing note and vitals reviewed. Constitutional: He is oriented to person, place, and time. He appears well-developed and well-nourished.  Cardiovascular: Normal rate.  Respiratory: Effort normal.  Musculoskeletal: Normal range of motion.  Neurological: He is alert and oriented to person, place, and time.  Skin: Skin is warm.    Review of Systems  Constitutional: Negative.   HENT: Negative.   Eyes: Negative.   Respiratory: Negative.   Cardiovascular: Negative.   Gastrointestinal: Negative.   Genitourinary: Negative.   Musculoskeletal: Negative.   Skin: Negative.   Neurological: Negative.   Endo/Heme/Allergies: Negative.   Psychiatric/Behavioral: Positive for depression, hallucinations, substance abuse  and suicidal ideas. The patient is nervous/anxious.     Blood pressure 140/90, pulse 89, temperature 98.1 F (36.7 C), temperature source Oral, resp. rate 18, SpO2 98 %.There is no height or weight on file to calculate BMI.   General Appearance: Bizarre and Disheveled  Eye Contact:  Fair  Speech:  Blocked  Volume:  Decreased  Mood:  Anxious, Depressed and Hopeless  Affect:  Flat  Thought Process:  Linear and Descriptions of Associations: Circumstantial  Orientation:  Full (Time, Place, and Person)  Thought Content:  Hallucinations: Visual  Suicidal Thoughts:  Yes.  without intent/plan  Homicidal Thoughts:  No  Memory:  Immediate;   Fair Recent;   Fair Remote;   Fair  Judgement:  Impaired  Insight:  Lacking  Psychomotor Activity:  Normal  Concentration: Concentration: Fair  Recall:  Good  Fund of Knowledge:Fair  Language: Fair  Akathisia:  No  Handed:  Right  AIMS (if indicated):     Assets:  Communication Skills Desire for Improvement Financial Resources/Insurance Physical Health  Sleep:        Treatment Plan Summary: Admit to [facility] OBS  Q15 minute checks Monitor for safety Restart medications  Observation Level/Precautions:  15 minute checks  Laboratory:  Ordered  Psychotherapy:    Medications:  See MAR  Consultations:  As needed  Discharge Concerns:  Compliance  Estimated LOS: 1 day  Other:     Physician Treatment Plan for Primary Diagnosis: Acute psychosis (HCC) Long Term Goal(s): Improvement in symptoms so as ready for discharge  Short Term Goals: Ability to identify changes in lifestyle to reduce recurrence of condition will improve, Ability to verbalize feelings will improve, Ability to disclose and discuss suicidal ideas and Ability to demonstrate self-control will improve  Physician Treatment Plan for Secondary Diagnosis: Principal Problem:   Acute psychosis (HCC)  Long Term Goal(s): Improvement in symptoms so as ready for discharge  Short Term  Goals: Ability to identify and develop effective coping behaviors will improve, Ability to maintain clinical measurements within normal limits will improve, Compliance with prescribed medications will improve  and Ability to identify triggers associated with substance abuse/mental health issues will improve  I certify that inpatient services furnished can reasonably be expected to improve the patient's condition.    Maryfrances Bunnell, FNP 5/1/20206:49 PM

## 2018-11-15 NOTE — ED Notes (Addendum)
CMP and BNP recollected and sent to lab

## 2018-11-15 NOTE — ED Notes (Signed)
Pt given ham sand-which, cheese-stick, water, and gingerale

## 2018-11-15 NOTE — ED Provider Notes (Signed)
[facility]-EMERGENCY DEPT Provider Note   CSN: [id number] Arrival date & time: 11/15/18  1141    History   Chief Complaint Chief Complaint  Patient presents with  . Fatigue  . Weakness    HPI Zafar Debrosse is a 23 y.o. male.     Dimitrius Steedman is a 23 y.o. male with history of schizophrenia, anxiety, depression, ADHD and asthma, who presents reporting generalized weakness and fatigue. Patient reports these symptoms have been presents for greater than one week. He reports some chills, no fevers. He denies cough, chest pain or shortness of breath. Has had some occasional nasal congestion which he attributes to pollen allergy, spends most of the day outside. Denies sire throat. No abdominal pain, N/V or diarrhea. Does reports some occasional dysuria which has been occurring for "a while" denies penile discharge. Reports his body hurts, but denies specific area of pain. No headaches, numbness, tingling or weakness. Has known psych history, denies SI or HI, denies hallucinations. On chart review patient has been seen in the ED multiple times recently with similar nonspecific complaints. Patient arrives today from [facility] and reports he is homeless.      Past Medical History:  Diagnosis Date  . ADHD (attention deficit hyperactivity disorder)   . Anxiety   . Asthma   . Depression   . Schizo-affective schizophrenia, chronic condition (HCC) 2019    Patient Active Problem List   Diagnosis Date Noted  . Psychosis (HCC) 10/29/2018  . Acute psychosis (HCC)   . Pneumomediastinum (HCC) 10/26/2018  . Toxic encephalopathy 10/26/2018  . Hypokalemia 10/26/2018  . MDD (major depressive disorder), recurrent episode, moderate (HCC) 06/20/2012  . PTSD (post-traumatic stress disorder) 06/20/2012  . 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 APPENDECTOMY     Pt was  23 yo        Home Medications    Prior to Admission medications   Medication Sig Start Date End Date Taking? Authorizing Provider  benztropine (COGENTIN) 1 MG tablet Take 1 tablet (1 mg total) by mouth 2 (two) times daily. 11/04/18   Malvin Johns, MD  fluticasone (FLONASE) 50 MCG/ACT nasal spray Place 2 sprays into both nostrils daily. 11/04/18   Malvin Johns, MD  haloperidol (HALDOL) 20 MG tablet Take 1 tablet (20 mg total) by mouth at bedtime. 11/04/18 11/04/19  Malvin Johns, MD  haloperidol decanoate (HALDOL DECANOATE) 50 MG/ML injection Inject 3 mLs (150 mg total) into the muscle every 28 (twenty-eight) days. 11/04/18   Malvin Johns, MD    Family History No family history on file.  Social History Social History   Tobacco Use  . Smoking status: Current Every Day Smoker    Packs/day: 0.50    Types: Cigarettes  . Smokeless tobacco: Never Used  Substance Use Topics  . Alcohol use: No  . Drug use: No     Allergies   Patient has no known allergies.   Review of Systems Review of Systems  Constitutional: Positive for chills and fatigue. Negative for fever.  HENT: Positive for congestion. Negative for ear pain, rhinorrhea and sore throat.   Eyes: Negative for visual disturbance.  Respiratory: Negative for cough, chest tightness and shortness of breath.   Cardiovascular: Negative for chest pain.  Gastrointestinal: Negative for abdominal pain, diarrhea, nausea and vomiting.  Genitourinary: Positive for dysuria. Negative for discharge, flank pain, frequency and genital sores.  Musculoskeletal: Positive for myalgias.  Negative for arthralgias, back pain, neck pain and neck stiffness.  Skin: Negative for color change and rash.  Neurological: Negative for dizziness, weakness, light-headedness, numbness and headaches.  Psychiatric/Behavioral: Negative for dysphoric mood, hallucinations and suicidal ideas.     Physical Exam Updated Vital Signs BP 117/67   Pulse 79   Temp 98.6 F  (37 C) (Oral)   Resp 17   SpO2 100%   Physical Exam Vitals signs and nursing note reviewed.  Constitutional:      General: He is not in acute distress.    Appearance: Normal appearance. He is well-developed and normal weight. He is not ill-appearing, toxic-appearing or diaphoretic.  HENT:     Head: Normocephalic and atraumatic.     Nose:     Comments: Bilateral nares patent with minimal mucosal edema and clear rhinorrhea present.     Mouth/Throat:     Mouth: Mucous membranes are moist.     Pharynx: Oropharynx is clear.     Comments: Posterior oropharynx clear and mucous membranes moist, there is no erythema, edema or tonsillar exudates, uvula midline, normal phonation, no trismus, tolerating secretions without difficulty. Eyes:     General:        Right eye: No discharge.        Left eye: No discharge.     Pupils: Pupils are equal, round, and reactive to light.  Neck:     Musculoskeletal: Normal range of motion and neck supple.  Cardiovascular:     Rate and Rhythm: Normal rate and regular rhythm.     Heart sounds: Normal heart sounds. No murmur. No friction rub. No gallop.   Pulmonary:     Effort: Pulmonary effort is normal. No respiratory distress.     Breath sounds: Normal breath sounds. No wheezing or rales.     Comments: Respirations equal and unlabored, patient able to speak in full sentences, lungs clear to auscultation bilaterally Abdominal:     General: Abdomen is flat. Bowel sounds are normal. There is no distension.     Palpations: Abdomen is soft. There is no mass.     Tenderness: There is no abdominal tenderness. There is no guarding.     Comments: Abdomen soft, nondistended, nontender to palpation in all quadrants without guarding or peritoneal signs  Musculoskeletal:        General: No deformity.  Skin:    General: Skin is warm and dry.     Capillary Refill: Capillary refill takes less than 2 seconds.  Neurological:     Mental Status: He is alert and oriented  to person, place, and time.     Coordination: Coordination normal.     Comments: Speech is clear, able to follow commands CN III-XII intact Normal strength in upper and lower extremities bilaterally including dorsiflexion and plantar flexion, strong and equal grip strength Sensation normal to light and sharp touch Moves extremities without ataxia, coordination intact  Psychiatric:        Attention and Perception: Attention normal.        Mood and Affect: Affect is blunt.        Speech: Speech normal.        Behavior: Behavior normal. Behavior is cooperative.        Thought Content: Thought content does not include homicidal or suicidal ideation.      ED Treatments / Results  Labs (all labs ordered are listed, but only abnormal results are displayed) Labs Reviewed  BASIC METABOLIC PANEL - Abnormal; Notable for  the following components:      Result Value   Calcium 8.5 (*)    All other components within normal limits  CBC WITH DIFFERENTIAL/PLATELET - Abnormal; Notable for the following components:   WBC 12.6 (*)    Neutro Abs 9.3 (*)    All other components within normal limits  URINALYSIS, ROUTINE W REFLEX MICROSCOPIC - Abnormal; Notable for the following components:   Color, Urine STRAW (*)    Specific Gravity, Urine 1.002 (*)    All other components within normal limits    EKG EKG Interpretation  Date/Time:  Friday Nov 15 2018 12:17:21 EDT Ventricular Rate:  84 PR Interval:    QRS Duration: 120 QT Interval:  387 QTC Calculation: 458 R Axis:   -93 Text Interpretation:  Sinus rhythm Incomplete RBBB and LAFB Lateral infarct, acute ST elevation, consider inferior injury No acute changes Confirmed by Derwood Kaplan ([id number]) on 11/15/2018 1:43:16 PM   Radiology Dg Chest Port 1 View  Result Date: 11/15/2018 CLINICAL DATA:  Weakness over the past week with chills over the past 2 days. Bipolar and schizophrenia. EXAM: PORTABLE CHEST 1 VIEW COMPARISON:  11/12/2018 FINDINGS:  Lungs are adequately inflated and otherwise clear. Cardiomediastinal silhouette, bones and soft tissues are normal. IMPRESSION: No active disease. Electronically Signed   By: Elberta Fortis M.D.   On: 11/15/2018 13:21    Procedures Procedures (including critical care time)  Medications Ordered in ED Medications  sodium chloride 0.9 % bolus 1,000 mL (0 mLs Intravenous Stopped 11/15/18 1531)  acetaminophen (TYLENOL) tablet 650 mg (650 mg Oral Given 11/15/18 1253)     Initial Impression / Assessment and Plan / ED Course  I have reviewed the triage vital signs and the nursing notes.  Pertinent labs & imaging results that were available during my care of the patient were reviewed by me and considered in my medical decision making (see chart for details).  Patient presents reporting generalized weakness and fatigue for greater than 1 week.  On arrival he is afebrile with normal vitals, overall well-appearing.  He has been seen in the ED multiple times recently with similar nonspecific complaints.  Known psych history but denies SI, HI or hallucinations and does not appear to be acutely psychotic at this time.  He does not have any associated cough or shortness of breath to suggest COVID-19 in setting of current pandemic.  He has had some mild congestion which he attributes to seasonal allergies and is typical for him at this time of year.  No GI symptoms and abdomen is benign.  No focal neurologic symptoms, normal neurologic exam today, no focal area of weakness.  No rash or skin lesions noted.  Given nonspecific symptoms will check basic labs, patient has reported some occasional dysuria will check UA and chest x-ray and give Tylenol and IV fluids.  I have discussed with patient that if these tests are reassuring he can be discharged with outpatient follow-up.,  Patient reports "I do not have a home where you going to discharge me to".  Discussed resources for shelters, he did come from [facility].  Suspect patient may be here with some intention of secondary gain.  He has been provided a meal.  Urinalysis with no signs of infection and chest x-ray is clear, images reviewed by myself, no evidence of pneumonia or other active cardiopulmonary disease.  At shift change labs are pending, care signed out to PA Sharen Heck who will follow-up on lab work, if  normal patient can be discharged home with outpatient follow-up.  Final Clinical Impressions(s) / ED Diagnoses   Final diagnoses:  Generalized weakness    ED Discharge Orders    None       Dartha LodgeFord, Chanson Teems N, [place] JerseyPA-C 11/18/18 1042    Derwood KaplanNanavati, Ankit, MD 11/19/18 1554

## 2018-11-15 NOTE — Progress Notes (Signed)
Patient ID: Dalton Rosario, male   DOB: 1995/10/25, 23 y.o.   MRN: [id number] Pt A&O x 3, no distress noted, calm & cooperative at present, presents with SI, no specific plan noted.  Pt reports any way I can.  Denies HI or AVH.  Pt has history of Schizophrenia, ADHD and Anxiety.  Monitoring for safety, Q 15 min checks in effect.

## 2018-11-15 NOTE — ED Triage Notes (Signed)
Per EMS: Pt from [facility] with c/o of feeling weak for the past week.  Pt c/o of chills for the past 2 days.  Pt hx of bipolar and schizophrenia.

## 2018-11-15 NOTE — ED Notes (Signed)
Bed: QA83 Expected date:  Expected time:  Means of arrival:  Comments: EMS- 21s M, fatigue and chills

## 2018-11-16 ENCOUNTER — Emergency Department (HOSPITAL_COMMUNITY)
Admission: EM | Admit: 2018-11-16 | Discharge: 2018-11-16 | Disposition: A | Discharge status: Home / Self Care | Payer: Medicaid Other | Attending: Emergency Medicine | Admitting: Emergency Medicine

## 2018-11-16 ENCOUNTER — Encounter (HOSPITAL_COMMUNITY): Payer: Self-pay

## 2018-11-16 DIAGNOSIS — R251 Tremor, unspecified: Secondary | ICD-10-CM

## 2018-11-16 DIAGNOSIS — Z590 Homelessness: Secondary | ICD-10-CM | POA: Diagnosis not present

## 2018-11-16 DIAGNOSIS — F909 Attention-deficit hyperactivity disorder, unspecified type: Secondary | ICD-10-CM | POA: Insufficient documentation

## 2018-11-16 DIAGNOSIS — F17210 Nicotine dependence, cigarettes, uncomplicated: Secondary | ICD-10-CM | POA: Diagnosis not present

## 2018-11-16 DIAGNOSIS — F419 Anxiety disorder, unspecified: Secondary | ICD-10-CM

## 2018-11-16 DIAGNOSIS — R419 Unspecified symptoms and signs involving cognitive functions and awareness: Secondary | ICD-10-CM | POA: Insufficient documentation

## 2018-11-16 DIAGNOSIS — F4310 Post-traumatic stress disorder, unspecified: Secondary | ICD-10-CM | POA: Diagnosis not present

## 2018-11-16 DIAGNOSIS — F251 Schizoaffective disorder, depressive type: Secondary | ICD-10-CM | POA: Insufficient documentation

## 2018-11-16 LAB — CBC WITH DIFFERENTIAL/PLATELET
Abs Immature Granulocytes: 0.03 K/uL (ref 0.00–0.07)
Basophils Absolute: 0 K/uL (ref 0.0–0.1)
Basophils Relative: 0 %
Eosinophils Absolute: 0.1 K/uL (ref 0.0–0.5)
Eosinophils Relative: 1 %
HCT: 39.8 % (ref 39.0–52.0)
Hemoglobin: 13.7 g/dL (ref 13.0–17.0)
Immature Granulocytes: 0 %
Lymphocytes Relative: 25 %
Lymphs Abs: 2.7 K/uL (ref 0.7–4.0)
MCH: 30.6 pg (ref 26.0–34.0)
MCHC: 34.4 g/dL (ref 30.0–36.0)
MCV: 88.8 fL (ref 80.0–100.0)
Monocytes Absolute: 0.6 K/uL (ref 0.1–1.0)
Monocytes Relative: 6 %
Neutro Abs: 7.2 K/uL (ref 1.7–7.7)
Neutrophils Relative %: 68 %
Platelets: 221 K/uL (ref 150–400)
RBC: 4.48 MIL/uL (ref 4.22–5.81)
RDW: 12.2 % (ref 11.5–15.5)
WBC: 10.6 K/uL — ABNORMAL HIGH (ref 4.0–10.5)
nRBC: 0 % (ref 0.0–0.2)

## 2018-11-16 LAB — COMPREHENSIVE METABOLIC PANEL
ALT: 16 U/L (ref 0–44)
ALT: 16 U/L (ref 0–44)
AST: 20 U/L (ref 15–41)
AST: 19 U/L (ref 15–41)
Albumin: 4.3 g/dL (ref 3.5–5.0)
Albumin: 4 g/dL (ref 3.5–5.0)
Alkaline Phosphatase: 54 U/L (ref 38–126)
Alkaline Phosphatase: 56 U/L (ref 38–126)
Anion gap: 8 (ref 5–15)
Anion gap: 10 (ref 5–15)
BUN: 9 mg/dL (ref 6–20)
BUN: 8 mg/dL (ref 6–20)
CO2: 27 mmol/L (ref 22–32)
CO2: 25 mmol/L (ref 22–32)
Calcium: 9.4 mg/dL (ref 8.9–10.3)
Calcium: 9.1 mg/dL (ref 8.9–10.3)
Chloride: 104 mmol/L (ref 98–111)
Chloride: 99 mmol/L (ref 98–111)
Creatinine, Ser: 0.92 mg/dL (ref 0.61–1.24)
Creatinine, Ser: 0.92 mg/dL (ref 0.61–1.24)
GFR calc Af Amer: >60 mL/min (ref >60)
GFR calc Af Amer: >60 mL/min (ref >60)
GFR calc non Af Amer: >60 mL/min (ref >60)
GFR calc non Af Amer: >60 mL/min (ref >60)
Glucose, Bld: 93 mg/dL (ref 70–99)
Glucose, Bld: 89 mg/dL (ref 70–99)
Potassium: 3.8 mmol/L (ref 3.5–5.1)
Potassium: 3.7 mmol/L (ref 3.5–5.1)
Sodium: 139 mmol/L (ref 135–145)
Sodium: 134 mmol/L — ABNORMAL LOW (ref 135–145)
Total Bilirubin: 0.8 mg/dL (ref 0.3–1.2)
Total Bilirubin: 0.8 mg/dL (ref 0.3–1.2)
Total Protein: 7.4 g/dL (ref 6.5–8.1)
Total Protein: 6.8 g/dL (ref 6.5–8.1)

## 2018-11-16 LAB — CK: Total CK: 244 U/L (ref 49–397)

## 2018-11-16 LAB — CBC
HCT: 44.5 % (ref 39.0–52.0)
Hemoglobin: 14.7 g/dL (ref 13.0–17.0)
MCH: 30.6 pg (ref 26.0–34.0)
MCHC: 33 g/dL (ref 30.0–36.0)
MCV: 92.7 fL (ref 80.0–100.0)
Platelets: 227 K/uL (ref 150–400)
RBC: 4.8 MIL/uL (ref 4.22–5.81)
RDW: 12.9 % (ref 11.5–15.5)
WBC: 8.2 K/uL (ref 4.0–10.5)
nRBC: 0 % (ref 0.0–0.2)

## 2018-11-16 LAB — ETHANOL: Alcohol, Ethyl (B): <10 mg/dL (ref <10)

## 2018-11-16 MED ORDER — BENZTROPINE MESYLATE 1 MG PO TABS: 1.0000 mg | ORAL_TABLET | Freq: Two times a day (BID) | ORAL | 0 refills | Status: DC

## 2018-11-16 MED ORDER — SODIUM CHLORIDE 0.9 % IV BOLUS
1000.0000 mL | Freq: Once | INTRAVENOUS | Status: AC
  Administered 2018-11-16: 21:00:00 1000 mL via INTRAVENOUS

## 2018-11-16 MED ORDER — LORAZEPAM 2 MG/ML IJ SOLN
0.5000 mg | Freq: Once | INTRAMUSCULAR | Status: AC
  Administered 2018-11-16: 21:00:00 0.5 mg via INTRAVENOUS
  Filled 2018-11-16: qty 1

## 2018-11-16 MED ORDER — HALOPERIDOL 20 MG PO TABS: 20.0000 mg | ORAL_TABLET | Freq: Every day | ORAL | 0 refills | Status: DC

## 2018-11-16 NOTE — Discharge Summary (Addendum)
Physician Discharge Summary Note  Patient:  Dalton Rosario is an 23 y.o., male MRN:  [id number] DOB:  March 15, 1996 Patient phone:  There is no home phone number on file.  Patient address:   [place] [address],  Total Time spent with patient: 30 minutes  Date of Admission:  11/15/2018 Date of Discharge: 11/16/18  Reason for Admission:  "Keep his head from swimming"   Principal Problem: PTSD (post-traumatic stress disorder) Discharge Diagnoses: Principal Problem:   PTSD (post-traumatic stress disorder)  Past Psychiatric History: PTSD, depression  Past Medical History:  Past Medical History:  Diagnosis Date  . ADHD (attention deficit hyperactivity disorder)   . Anxiety   . Asthma   . Depression   . Schizo-affective schizophrenia, chronic condition ([place]) 2019    Past Surgical History:  Procedure Laterality Date  . APPENDECTOMY     Pt was 23 yo   Family History: History reviewed. No pertinent family history. Family Psychiatric  History: father with substance abuse issues Social History:  Social History   Substance and Sexual Activity  Alcohol Use No     Social History   Substance and Sexual Activity  Drug Use No    Social History   Socioeconomic History  . Marital status: Single    Spouse name: Not on file  . Number of children: Not on file  . Years of education: Not on file  . Highest education level: Not on file  Occupational History  . Not on file  Social Needs  . Financial resource strain: Not on file  . Food insecurity:    Worry: Not on file    Inability: Not on file  . Transportation needs:    Medical: Not on file    Non-medical: Not on file  Tobacco Use  . Smoking status: Current Every Day Smoker    Packs/day: 0.50    Types: Cigarettes  . Smokeless tobacco: Never Used  Substance and Sexual Activity  . Alcohol use: No  . Drug use: No  . Sexual activity: Never  Lifestyle  . Physical activity:    Days per week: Not on file    Minutes per  session: Not on file  . Stress: Not on file  Relationships  . Social connections:    Talks on phone: Not on file    Gets together: Not on file    Attends religious service: Not on file    Active member of club or organization: Not on file    Attends meetings of clubs or organizations: Not on file    Relationship status: Not on file  Other Topics Concern  . Not on file  Social History Narrative  . Not on file    Hospital Course:  On admission 11/15/18:  23 y.o. male that presents this date voluntary with S/I. Patient does not voice a specific plan and states "anyway I can" in reference to self harm. Patient is observed to be very disorganized and displays active thought blocking at the time of assessment. Patient is a poor historian and renders conflicting history. Patient does not seem to process the content of this writer's questions at times and is difficult to redirect. Patient denies any current SA use although per chart review has a history of polysubstance use. Patient's UDS is pending. Patient is oriented to time and place presenting with a flat affect. Patient speaks in a low soft voice and states he is presenting today to "keep his head from swimming." Patient will not elaborate  on symptoms and seems to have difficultly verbalizing symptoms. Patient was last seen on 11/08/18 per that assessment note.  "Patient states his concern this date is "not having any where to go" which he reports is his primary stressor. Patienthas a history ofschizophreniaanddoes not take any medication. Patient denies H/I or AVH. Patient denies having any OP provider or currently being on any MH medications. Patient denies any previous attempts or gestures at self harm. Per chart review patient was last seen in 2013 when he presented with S/I. Patient was being [facility] by [facility] at that time. Per chart review patient was last seen on 11/04/18 by Jake Samples MD who notes patient presented at that time with a  "bloody nose" and has a history of Schizophrenia. Patient was discharged after receiving medications to assist with symptom management (see Epic note)". He is chronically homeless and has schizophrenia.   Medications:  Continued Haldol 20 mg at bedtime for mood/psychosis, Cogentin 1 mg BID for EPS  11/16/18:  Patient has met maximum benefit of hospitalization.  No suicidal/homicidal ideations, hallucinations, and substance abuse.  Discharged instructions provided with 24 hour crisis number and encouragement to follow up with [facility].  Physical Findings: AIMS: Facial and Oral Movements Muscles of Facial Expression: None, normal Lips and Perioral Area: None, normal Jaw: None, normal Tongue: None, normal,Extremity Movements Upper (arms, wrists, hands, fingers): None, normal Lower (legs, knees, ankles, toes): None, normal, Trunk Movements Neck, shoulders, hips: None, normal, Overall Severity Severity of abnormal movements (highest score from questions above): None, normal Incapacitation due to abnormal movements: None, normal Patient's awareness of abnormal movements (rate only patient's report): No Awareness, Dental Status Current problems with teeth and/or dentures?: No Does patient usually wear dentures?: No  CIWA:    COWS:     Musculoskeletal: Strength & Muscle Tone: within normal limits Gait & Station: normal Patient leans: N/A  Psychiatric Specialty Exam: Physical Exam  Nursing note and vitals reviewed. Constitutional: He is oriented to person, place, and time. He appears well-developed and well-nourished.  HENT:  Head: Normocephalic.  Neck: Normal range of motion.  Respiratory: Effort normal.  Musculoskeletal: Normal range of motion.  Neurological: He is alert and oriented to person, place, and time.  Psychiatric: His speech is normal and behavior is normal. Judgment and thought content normal. His mood appears anxious. His affect is blunt. Cognition and memory are normal.     Review of Systems  Psychiatric/Behavioral: The patient is nervous/anxious.   All other systems reviewed and are negative.   Blood pressure 140/90, pulse 89, temperature 98.1 F (36.7 C), temperature source Oral, resp. rate 18, SpO2 98 %.There is no height or weight on file to calculate BMI.  General Appearance: Casual  Eye Contact:  Good  Speech:  Clear and Coherent  Volume:  Normal  Mood:  Anxious  Affect:  Blunt  Thought Process:  Coherent and Descriptions of Associations: Intact  Orientation:  Full (Time, Place, and Person)  Thought Content:  WDL and Logical  Suicidal Thoughts:  No  Homicidal Thoughts:  No  Memory:  Immediate;   Good Recent;   Good Remote;   Good  Judgement:  Fair  Insight:  Fair  Psychomotor Activity:  Normal  Concentration:  Concentration: Good and Attention Span: Good  Recall:  Good  Fund of Knowledge:  Fair  Language:  Good  Akathisia:  No  Handed:  Right  AIMS (if indicated):     Assets:  Leisure Time Physical Health Resilience  ADL's:  Intact  Cognition:  WNL  Sleep:           Has this patient used any form of tobacco in the last 30 days? (Cigarettes, Smokeless Tobacco, Cigars, and/or Pipes)  N/A  Blood Alcohol level:  Lab Results  Component Value Date   ETH <10 11/12/2018   ETH <10 33/38/3291    Metabolic Disorder Labs:  Lab Results  Component Value Date   HGBA1C 5.4 06/21/2012   MPG 108 06/21/2012   No results found for: PROLACTIN Lab Results  Component Value Date   CHOL 201 (H) 06/21/2012   TRIG 207 (H) 06/21/2012   HDL 42 06/21/2012   CHOLHDL 4.8 06/21/2012   VLDL 41 (H) 06/21/2012   LDLCALC 118 (H) 06/21/2012    See Psychiatric Specialty Exam and Suicide Risk Assessment completed by Attending Physician prior to discharge.  Discharge destination:  Home  Is patient on multiple antipsychotic therapies at discharge:  No   Has Patient had three or more failed trials of antipsychotic monotherapy by history:   No  Recommended Plan for Multiple Antipsychotic Therapies: NA  Discharge Instructions    Diet - low sodium heart healthy   Complete by:  As directed    Discharge instructions   Complete by:  As directed    Follow up with outpatient provider   Increase activity slowly   Complete by:  As directed      Allergies as of 11/16/2018   No Known Allergies     Medication List    TAKE these medications     Indication  benztropine 1 MG tablet Commonly known as:  COGENTIN Take 1 tablet (1 mg total) by mouth 2 (two) times daily.  Indication:  Extrapyramidal Reaction caused by Medications   diphenhydrAMINE 25 mg capsule Commonly known as:  BENADRYL Take 25 mg by mouth every 6 (six) hours as needed for allergies (pain/ache).  Indication:  Allergic Conjunctivitis   fluticasone 50 MCG/ACT nasal spray Commonly known as:  FLONASE Place 2 sprays into both nostrils daily.  Indication:  Signs and Symptoms of Nose Diseases   haloperidol 20 MG tablet Commonly known as:  HALDOL Take 1 tablet (20 mg total) by mouth at bedtime.  Indication:  Hypomanic Episode of Bipolar Disorder   haloperidol decanoate 50 MG/ML injection Commonly known as:  HALDOL DECANOATE Inject 3 mLs (150 mg total) into the muscle every 28 (twenty-eight) days.  Indication:  Schizophrenia       Follow-up recommendations:  PTSD:   -Continue Haldol 20 mg at bedtime and haldol decanante on 12/02/18 for psychosis  Allergies: -Continue fluticasone 50 mcg 2 sprays into both nostrils daily -Benadryl 25 mg every 6 hours PRN allergies  EPS prevention: -Continue Cogentin 1 mg BID    Activity:  as tolerated Diet:  heart healthy diet  Comments:  Follow up with [facility]  Signed: Waylan Boga, NP 11/16/2018, 11:15 AM  Patient seen face-to-face for psychiatric evaluation, chart reviewed and case discussed with the physician extender and developed treatment plan. Reviewed the information documented and agree with the treatment  plan. Corena Pilgrim, MD

## 2018-11-16 NOTE — ED Triage Notes (Signed)
[place] [facility] [place] and "not feeling right". Pt states he feels "distant". Pt denies CP/SOB/Dizziness. Pt has hx of bipolar disorder, does not take medication. Per EMS pt also c/o tremors and dehydration.

## 2018-11-16 NOTE — Discharge Instructions (Addendum)
Take haldol and cogentin as prescribed.   See counselor for follow up   See your doctor  Return to ER if you have thoughts of harming yourself or others, hallucinations

## 2018-11-16 NOTE — Progress Notes (Signed)
Patient ID: Dalton Rosario, male   DOB: 06/12/1996, 23 y.o.   MRN: [id number]   Patient discharged to home/self care with referral services.  Patient denies SI, HI and AVH upon discharge.  Patient acknowledged understanding of all discharge instructions and will follow up in the community.

## 2018-11-16 NOTE — ED Provider Notes (Signed)
[facility] EMERGENCY DEPARTMENT Provider Note   CSN: [id number] Arrival date & time: 11/16/18  1744    History   Chief Complaint Chief Complaint  Patient presents with  . Tremors    HPI Edmar Igarashi is a 23 y.o. male history anxiety, depression, here presenting with tremors, anxiety, homelessness.  Patient was admitted overnight yesterday at behavioral health.  He apparently was discharged this morning and came back because he states that he is persistently anxious and has tremors.  He denies any fall or injury.  He states that he just wants to go to sleep but denies any particular plan to kill himself.  Denies any drug overdose.      The history is provided by the patient.    Past Medical History:  Diagnosis Date  . ADHD (attention deficit hyperactivity disorder)   . Anxiety   . Asthma   . Depression   . Schizo-affective schizophrenia, chronic condition (HCC) 2019    Patient Active Problem List   Diagnosis Date Noted  . Pneumomediastinum (HCC) 10/26/2018  . Toxic encephalopathy 10/26/2018  . Hypokalemia 10/26/2018  . PTSD (post-traumatic stress disorder) 06/20/2012  . 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 APPENDECTOMY     Pt was 23 yo        Home Medications    Prior to Admission medications   Medication Sig Start Date End Date Taking? Authorizing Provider  diphenhydrAMINE (BENADRYL) 25 mg capsule Take 25 mg by mouth every 6 (six) hours as needed for allergies (pain/ache).   Yes [provider]  benztropine (COGENTIN) 1 MG tablet Take 1 tablet (1 mg total) by mouth 2 (two) times daily. Patient not taking: Reported on 11/16/2018 11/04/18   Malvin Johns, MD  fluticasone [facility]) 50 MCG/ACT nasal spray Place 2 sprays into both nostrils daily. Patient not taking: Reported on 11/16/2018 11/04/18   Malvin Johns, MD  haloperidol (HALDOL) 20 MG tablet  Take 1 tablet (20 mg total) by mouth at bedtime. Patient not taking: Reported on 11/16/2018 11/04/18 11/04/19  Malvin Johns, MD  haloperidol decanoate (HALDOL DECANOATE) 50 MG/ML injection Inject 3 mLs (150 mg total) into the muscle every 28 (twenty-eight) days. Patient not taking: Reported on 11/16/2018 11/04/18   Malvin Johns, MD    Family History No family history on file.  Social History Social History   Tobacco Use  . Smoking status: Current Every Day Smoker    Packs/day: 0.50    Types: Cigarettes  . Smokeless tobacco: Never Used  Substance Use Topics  . Alcohol use: No  . Drug use: No     Allergies   Patient has no known allergies.   Review of Systems Review of Systems  Neurological: Positive for tremors.  All other systems reviewed and are negative.    Physical Exam Updated Vital Signs BP 133/90 (BP Location: Right Arm)   Pulse 74   Temp 98.6 F (37 C) (Oral)   Resp 15   SpO2 98%   Physical Exam Vitals signs and nursing note reviewed.  Constitutional:      Comments: Anxious   HENT:     Head: Normocephalic.     Nose: Nose normal.     Mouth/Throat:     Mouth: Mucous membranes are moist.  Eyes:     Extraocular Movements: Extraocular movements intact.     Pupils: Pupils are equal, round, and reactive to light.  Neck:  Musculoskeletal: Normal range of motion.  Cardiovascular:     Rate and Rhythm: Normal rate and regular rhythm.     Pulses: Normal pulses.  Pulmonary:     Effort: Pulmonary effort is normal.     Breath sounds: Normal breath sounds.  Abdominal:     General: Abdomen is flat.     Palpations: Abdomen is soft.  Musculoskeletal: Normal range of motion.  Skin:    General: Skin is warm.     Capillary Refill: Capillary refill takes less than 2 seconds.  Neurological:     General: No focal deficit present.     Mental Status: He is alert.  Psychiatric:     Comments: Anxious       ED Treatments / Results  Labs (all labs ordered are  listed, but only abnormal results are displayed) Labs Reviewed  CBC WITH DIFFERENTIAL/PLATELET - Abnormal; Notable for the following components:      Result Value   WBC 10.6 (*)    All other components within normal limits  COMPREHENSIVE METABOLIC PANEL - Abnormal; Notable for the following components:   Sodium 134 (*)    All other components within normal limits  CK  ETHANOL    EKG None  Radiology Dg Chest Port 1 View  Result Date: 11/15/2018 CLINICAL DATA:  Weakness over the past week with chills over the past 2 days. Bipolar and schizophrenia. EXAM: PORTABLE CHEST 1 VIEW COMPARISON:  11/12/2018 FINDINGS: Lungs are adequately inflated and otherwise clear. Cardiomediastinal silhouette, bones and soft tissues are normal. IMPRESSION: No active disease. Electronically Signed   By: Elberta Fortisaniel  Boyle M.D.   On: 11/15/2018 13:21    Procedures Procedures (including critical care time)  Medications Ordered in ED Medications  sodium chloride 0.9 % bolus 1,000 mL (1,000 mLs Intravenous New Bag/Given 11/16/18 2034)  LORazepam (ATIVAN) injection 0.5 mg (0.5 mg Intravenous Given 11/16/18 2035)     Initial Impression / Assessment and Plan / ED Course  I have reviewed the triage vital signs and the nursing notes.  Pertinent labs & imaging results that were available during my care of the patient were reviewed by me and considered in my medical decision making (see chart for details).       Cameron AliJose Hascall is a 23 y.o. male here with anxiety, tremors.  He is homeless as well so I wonder if he has a secondary gain.  Given that he was just admitted to behavioral health yesterday, will reconsult behavioral health.  Will get CK level and hydrate and give some Ativan as well. Of note, he did have pneumomediastinum several weeks ago but has normal oxygen saturation and had CXR yesterday that was normal and has no shortness of breath.   10:07 PM Psych saw patient and recommend discharge. CK normal. Stable  for discharge.    Final Clinical Impressions(s) / ED Diagnoses   Final diagnoses:  None    ED Discharge Orders    None       Charlynne PanderYao, Terree Gaultney Hsienta, MD 11/16/18 2208
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
## 2018-11-16 NOTE — ED Triage Notes (Signed)
Pt returns to the ED after being discharged earlier today. Pt returns for the same symptoms as yesterday and tells provider "here to get some rest.... tired."

## 2018-11-16 NOTE — Progress Notes (Signed)
Pt remains sleeping at present, no distress noted, calm & cooperative. No complaints voiced.  Monitoring for safety.

## 2018-11-16 NOTE — ED Notes (Signed)
Nurse drawing labs. 

## 2018-11-18 ENCOUNTER — Encounter (HOSPITAL_COMMUNITY): Payer: Self-pay | Admitting: Emergency Medicine

## 2018-11-18 ENCOUNTER — Other Ambulatory Visit: Payer: Self-pay

## 2018-11-18 ENCOUNTER — Emergency Department (HOSPITAL_COMMUNITY)
Admission: EM | Admit: 2018-11-18 | Discharge: 2018-11-18 | Disposition: A | Discharge status: Home / Self Care | Payer: Medicaid Other | Attending: Emergency Medicine | Admitting: Emergency Medicine

## 2018-11-18 DIAGNOSIS — F17210 Nicotine dependence, cigarettes, uncomplicated: Secondary | ICD-10-CM | POA: Insufficient documentation

## 2018-11-18 DIAGNOSIS — Z5900 Homelessness unspecified: Secondary | ICD-10-CM

## 2018-11-18 DIAGNOSIS — Z9114 Patient's other noncompliance with medication regimen: Secondary | ICD-10-CM | POA: Diagnosis not present

## 2018-11-18 DIAGNOSIS — M545 Low back pain, unspecified: Secondary | ICD-10-CM

## 2018-11-18 DIAGNOSIS — F209 Schizophrenia, unspecified: Secondary | ICD-10-CM | POA: Diagnosis not present

## 2018-11-18 DIAGNOSIS — Z79899 Other long term (current) drug therapy: Secondary | ICD-10-CM | POA: Diagnosis not present

## 2018-11-18 DIAGNOSIS — J45909 Unspecified asthma, uncomplicated: Secondary | ICD-10-CM | POA: Diagnosis not present

## 2018-11-18 NOTE — ED Triage Notes (Signed)
Pt in with c/o "back pain and side effects from Haldol". States he stopped taking the meds d/t side effects. Primary cc today is back pain and generalized body pain.

## 2018-11-18 NOTE — ED Notes (Signed)
Patient given discharge instructions and requesting to not be discharged. MD aware and states patient does not meet criteria to stay. Patient requesting information for [facility]. Patient given resources with discharge instructions and left in NAD.

## 2018-11-18 NOTE — Discharge Instructions (Addendum)
Please get your Haldol filled and take as prescribed Please follow-up with your mental health providers as previously scheduled

## 2018-11-18 NOTE — ED Provider Notes (Signed)
[facility] Provider Note   CSN: [id number] Arrival date & time: 11/18/18  [id number]    History   Chief Complaint Chief Complaint  Patient presents with  . Back Pain    HPI Cameron AliJose Bedel is a 23 y.o. male.     HPI  [age] year old male schizoaffective disorder, history of depression, anxiety, who was discharged from behavioral health last week presents today complaining that he feels that he is having side effects from Haldol and Cogentin.  He states he is having some stuttering and drooling.  He also states that he feels like his back is swelling some.  He is homeless.  He has not taken his medication since discharge.  He denies taking any medications currently.  He denies any pain in his eyes, neck spasms, chest pain, shortness of breath, nausea, vomiting, diarrhea, fever, chills, rashes, or lower extremity weakness, problems with urination or defecation.  He denies any recent trauma.  He denies suicidal ideation, homicidal ideation ideation, or any psychoses.  Past Medical History:  Diagnosis Date  . ADHD (attention deficit hyperactivity disorder)   . Anxiety   . Asthma   . Depression   . Schizo-affective schizophrenia, chronic condition (HCC) 2019    Patient Active Problem List   Diagnosis Date Noted  . Pneumomediastinum (HCC) 10/26/2018  . Toxic encephalopathy 10/26/2018  . Hypokalemia 10/26/2018  . PTSD (post-traumatic stress disorder) 06/20/2012  . 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 APPENDECTOMY     Pt was 23 yo        Home Medications    Prior to Admission medications   Medication Sig Start Date End Date Taking? Authorizing Provider  benztropine (COGENTIN) 1 MG tablet Take 1 tablet (1 mg total) by mouth 2 (two) times daily. 11/16/18   Charlynne PanderYao, David Hsienta, MD  diphenhydrAMINE (BENADRYL) 25 mg capsule Take 25 mg by mouth every 6 (six)  hours as needed for allergies (pain/ache).    [provider]  fluticasone (FLONASE) 50 MCG/ACT nasal spray Place 2 sprays into both nostrils daily. Patient not taking: Reported on 11/16/2018 11/04/18   Malvin JohnsFarah, Brian, MD  haloperidol (HALDOL) 20 MG tablet Take 1 tablet (20 mg total) by mouth at bedtime for 10 days. 11/16/18 11/26/18  Charlynne PanderYao, David Hsienta, MD  haloperidol decanoate (HALDOL DECANOATE) 50 MG/ML injection Inject 3 mLs (150 mg total) into the muscle every 28 (twenty-eight) days. Patient not taking: Reported on 11/16/2018 11/04/18   Malvin JohnsFarah, Brian, MD    Family History No family history on file.  Social History Social History   Tobacco Use  . Smoking status: Current Every Day Smoker    Packs/day: 0.50    Types: Cigarettes  . Smokeless tobacco: Never Used  Substance Use Topics  . Alcohol use: No  . Drug use: No     Allergies   Patient has no known allergies.   Review of Systems Review of Systems  All other systems reviewed and are negative.    Physical Exam Updated Vital Signs BP 120/76 (BP Location: Right Arm)   Pulse 86   Temp 98.2 F (36.8 C) (Oral)   Resp 14   Wt 90.7 kg   SpO2 99%   BMI 32.27 kg/m   Physical Exam Vitals signs and nursing note reviewed.  Constitutional:      Appearance: He is normal weight.     Comments: Unkempt appearance  HENT:  Head: Normocephalic.     Right Ear: External ear normal.     Left Ear: External ear normal.     Nose: Nose normal.  Eyes:     Extraocular Movements: Extraocular movements intact.     Pupils: Pupils are equal, round, and reactive to light.  Neck:     Musculoskeletal: Normal range of motion and neck supple.  Cardiovascular:     Rate and Rhythm: Normal rate and regular rhythm.  Pulmonary:     Effort: Pulmonary effort is normal.     Breath sounds: Normal breath sounds.  Abdominal:     General: Abdomen is flat. Bowel sounds are normal.     Palpations: Abdomen is soft.     Tenderness: There is no  abdominal tenderness.  Musculoskeletal: Normal range of motion.  Skin:    General: Skin is warm.     Capillary Refill: Capillary refill takes less than 2 seconds.  Neurological:     General: No focal deficit present.     Mental Status: He is alert and oriented to person, place, and time. Mental status is at baseline.     Cranial Nerves: No cranial nerve deficit.     Motor: No weakness.     Gait: Gait normal.  Psychiatric:        Attention and Perception: Attention normal.        Mood and Affect: Mood is anxious.        Speech: Speech normal.        Behavior: Behavior normal.        Thought Content: Thought content does not include homicidal or suicidal plan.        Cognition and Memory: Cognition normal.      ED Treatments / Results  Labs (all labs ordered are listed, but only abnormal results are displayed) Labs Reviewed - No data to display  EKG None  Radiology No results found.  Procedures Procedures (including critical care time)  Medications Ordered in ED Medications - No data to display   Initial Impression / Assessment and Plan / ED Course  I have reviewed the triage vital signs and the nursing notes.  Pertinent labs & imaging results that were available during my care of the patient were reviewed by me and considered in my medical decision making (see chart for details).    This is a 23 year old male who presents today complaining of side effects of his Haldol.  However, he has not been taking his Haldol as prescribed since his discharge.  He has his papers with him.  He has not had this filled.  I discussed with him that he should have this filled.  Plan outpatient follow-up with behavioral health.    Final Clinical Impressions(s) / ED Diagnoses   Final diagnoses:  Low back pain, unspecified back pain laterality, unspecified chronicity, unspecified whether sciatica present  Schizophrenia, unspecified type (HCC)  Noncompliance with medications    ED  Discharge Orders    None       Margarita Grizzle, MD 11/18/18 1144

## 2018-11-19 ENCOUNTER — Ambulatory Visit (HOSPITAL_COMMUNITY)
Admission: RE | Admit: 2018-11-19 | Discharge: 2018-11-19 | Disposition: A | Discharge status: Home / Self Care | Payer: No Typology Code available for payment source | Attending: Psychiatry | Admitting: Psychiatry

## 2018-11-19 DIAGNOSIS — F909 Attention-deficit hyperactivity disorder, unspecified type: Secondary | ICD-10-CM | POA: Diagnosis not present

## 2018-11-19 DIAGNOSIS — F17210 Nicotine dependence, cigarettes, uncomplicated: Secondary | ICD-10-CM | POA: Insufficient documentation

## 2018-11-19 DIAGNOSIS — R45851 Suicidal ideations: Secondary | ICD-10-CM | POA: Diagnosis not present

## 2018-11-19 DIAGNOSIS — F251 Schizoaffective disorder, depressive type: Secondary | ICD-10-CM | POA: Insufficient documentation
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
## 2018-11-19 NOTE — H&P (Signed)
Behavioral Health Medical Screening Exam  Dalton Rosario is an 23 y.o. male.  Patient presented as a walk-in to [facility] requesting a medication for counteraction of medication side effects.  Patient is unable to state what side effects he is referring to.  Denies headaches nausea vomiting diarrhea.  Denies muscle aches/spasms.  Reports he has not taken medication since his inpatient admission 2 weeks prior.  Chart reviewed multiple assessments and presentations of local emergency departments.  States he does not want a follow-up with [facility] at this time.  NP reprinted discharge disposition summary. encourage patient to follow-up with outpatient provider.  Support encouragement reassurance was provided.   Total Time spent with patient: 15 minutes  Psychiatric Specialty Exam: Physical Exam  Nursing note and vitals reviewed. Constitutional: He appears well-developed.  Psychiatric: He has a normal mood and affect. His behavior is normal.    Review of Systems  Psychiatric/Behavioral: Negative for hallucinations and suicidal ideas (passive ideaitons).  All other systems reviewed and are negative.   Blood pressure 123/79, pulse 75, temperature 98.7 F (37.1 C), temperature source Oral, resp. rate 16, SpO2 97 %.There is no height or weight on file to calculate BMI.  General Appearance: Disheveled    Eye Contact:  Fair  Speech:  Clear and Coherent  Volume:  Normal  Mood:  Anxious  Affect:  Congruent  Thought Process:  Coherent and Linear  Orientation:  Full (Time, Place, and Person)  Thought Content:  Logical  Suicidal Thoughts:  No passive ideations  Homicidal Thoughts:  No  Memory:  Immediate;   Fair Recent;   Fair Remote;   Fair  Judgement:  Fair  Insight:  Shallow  Psychomotor Activity:  Normal  Concentration: Concentration: Fair  Recall:  [employer] of Knowledge:Fair  Language: Fair  Akathisia:  No  Handed:  Right  AIMS (if indicated):     Assets:  Communication Skills Desire for  Improvement Resilience Social Support  Sleep:       Musculoskeletal: Strength & Muscle Tone: within normal limits Gait & Station: normal Patient leans: N/A  Blood pressure 123/79, pulse 75, temperature 98.7 F (37.1 C), temperature source Oral, resp. rate 16, SpO2 97 %.  Recommendations: Keep follow-up with [facility]  Based on my evaluation the patient does not appear to have an emergency medical condition.  Oneta Rack, NP 11/19/2018, 9:46 AM

## 2018-11-20 ENCOUNTER — Other Ambulatory Visit: Payer: Self-pay

## 2018-11-20 ENCOUNTER — Encounter (HOSPITAL_COMMUNITY): Payer: Self-pay | Admitting: *Deleted

## 2018-11-20 ENCOUNTER — Emergency Department (HOSPITAL_COMMUNITY)
Admission: EM | Admit: 2018-11-20 | Discharge: 2018-11-20 | Disposition: A | Discharge status: Home / Self Care | Payer: Medicaid Other | Attending: Emergency Medicine | Admitting: Emergency Medicine

## 2018-11-20 DIAGNOSIS — M7918 Myalgia, other site: Secondary | ICD-10-CM | POA: Insufficient documentation

## 2018-11-20 DIAGNOSIS — Z590 Homelessness: Secondary | ICD-10-CM | POA: Diagnosis not present

## 2018-11-20 DIAGNOSIS — F17210 Nicotine dependence, cigarettes, uncomplicated: Secondary | ICD-10-CM | POA: Diagnosis not present

## 2018-11-20 DIAGNOSIS — F909 Attention-deficit hyperactivity disorder, unspecified type: Secondary | ICD-10-CM | POA: Insufficient documentation

## 2018-11-20 DIAGNOSIS — F259 Schizoaffective disorder, unspecified: Secondary | ICD-10-CM | POA: Diagnosis not present

## 2018-11-20 DIAGNOSIS — R509 Fever, unspecified: Secondary | ICD-10-CM | POA: Insufficient documentation

## 2018-11-20 DIAGNOSIS — R52 Pain, unspecified: Secondary | ICD-10-CM

## 2018-11-20 LAB — ETHANOL: Alcohol, Ethyl (B): <10 mg/dL (ref <10)

## 2018-11-20 LAB — COMPREHENSIVE METABOLIC PANEL
ALT: 15 U/L (ref 0–44)
AST: 17 U/L (ref 15–41)
Albumin: 3.8 g/dL (ref 3.5–5.0)
Alkaline Phosphatase: 54 U/L (ref 38–126)
Anion gap: 11 (ref 5–15)
BUN: 5 mg/dL — ABNORMAL LOW (ref 6–20)
CO2: 24 mmol/L (ref 22–32)
Calcium: 9.1 mg/dL (ref 8.9–10.3)
Chloride: 106 mmol/L (ref 98–111)
Creatinine, Ser: 0.83 mg/dL (ref 0.61–1.24)
GFR calc Af Amer: >60 mL/min (ref >60)
GFR calc non Af Amer: >60 mL/min (ref >60)
Glucose, Bld: 108 mg/dL — ABNORMAL HIGH (ref 70–99)
Potassium: 3.7 mmol/L (ref 3.5–5.1)
Sodium: 141 mmol/L (ref 135–145)
Total Bilirubin: 0.4 mg/dL (ref 0.3–1.2)
Total Protein: 6.5 g/dL (ref 6.5–8.1)

## 2018-11-20 LAB — CBC WITH DIFFERENTIAL/PLATELET
Abs Immature Granulocytes: 0.01 10*3/uL (ref 0.00–0.07)
Basophils Absolute: 0 10*3/uL (ref 0.0–0.1)
Basophils Relative: 0 %
Eosinophils Absolute: 0 10*3/uL (ref 0.0–0.5)
Eosinophils Relative: 0 %
HCT: 39.8 % (ref 39.0–52.0)
Hemoglobin: 13.5 g/dL (ref 13.0–17.0)
Immature Granulocytes: 0 %
Lymphocytes Relative: 18 %
Lymphs Abs: 1.5 10*3/uL (ref 0.7–4.0)
MCH: 30.4 pg (ref 26.0–34.0)
MCHC: 33.9 g/dL (ref 30.0–36.0)
MCV: 89.6 fL (ref 80.0–100.0)
Monocytes Absolute: 0.6 10*3/uL (ref 0.1–1.0)
Monocytes Relative: 7 %
Neutro Abs: 6 10*3/uL (ref 1.7–7.7)
Neutrophils Relative %: 75 %
Platelets: 223 10*3/uL (ref 150–400)
RBC: 4.44 MIL/uL (ref 4.22–5.81)
RDW: 12.6 % (ref 11.5–15.5)
WBC: 8.2 10*3/uL (ref 4.0–10.5)
nRBC: 0 % (ref 0.0–0.2)

## 2018-11-20 LAB — CK: Total CK: 176 U/L (ref 49–397)

## 2018-11-20 MED ORDER — SODIUM CHLORIDE 0.9 % IV BOLUS
1000.0000 mL | Freq: Once | INTRAVENOUS | Status: AC
  Administered 2018-11-20: 1000 mL via INTRAVENOUS

## 2018-11-20 NOTE — ED Provider Notes (Signed)
[facility] EMERGENCY DEPARTMENT Provider Note   CSN: [id number] Arrival date & time: 11/20/18  1403    History   Chief Complaint Chief Complaint  Patient presents with  . Generalized Body Aches    HPI Dalton Rosario is a 23 y.o. male history of anxiety, depression, schizoaffective schizophrenia, ADHD, asthma presenting today for subjective fever and body aches.  Patient reports that over the past day he has felt warm and sweaty is not sure why.  Patient is homeless and has been outside for long periods of time and has been upwards of 80 degrees.  Additionally patient reports generalized body aches without focal area of pain that has been going on for approximately 1 week he denies any injury or trauma.  Patient has not been taking any of his psychiatric medications.  Patient denies suicidal or homicidal ideations.  He denies visual or auditory hallucinations.  He denies any additional concerns.     HPI  Past Medical History:  Diagnosis Date  . ADHD (attention deficit hyperactivity disorder)   . Anxiety   . Asthma   . Depression   . Schizo-affective schizophrenia, chronic condition (HCC) 2019    Patient Active Problem List   Diagnosis Date Noted  . Pneumomediastinum (HCC) 10/26/2018  . Toxic encephalopathy 10/26/2018  . Hypokalemia 10/26/2018  . PTSD (post-traumatic stress disorder) 06/20/2012  . 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 APPENDECTOMY     Pt was 23 yo        Home Medications    Prior to Admission medications   Not on File    Family History History reviewed. No pertinent family history.  Social History Social History   Tobacco Use  . Smoking status: Current Every Day Smoker    Packs/day: 0.50    Types: Cigarettes  . Smokeless tobacco: Never Used  Substance Use Topics  . Alcohol use: No  . Drug use: No     Allergies   Patient  has no known allergies.   Review of Systems Review of Systems  Constitutional: Negative.  Negative for chills and fever.  Respiratory: Negative.  Negative for cough and shortness of breath.   Cardiovascular: Negative.  Negative for chest pain.  Gastrointestinal: Negative.  Negative for abdominal pain, diarrhea, nausea and vomiting.  Musculoskeletal: Positive for arthralgias and myalgias. Negative for neck pain and neck stiffness.  Neurological: Negative.  Negative for weakness, numbness and headaches.       Denies saddle paresthesias Denies bowel/bladder incontinence Denies urinary retention  Psychiatric/Behavioral: Negative for self-injury and suicidal ideas.  All other systems reviewed and are negative.  Physical Exam Updated Vital Signs BP 123/79   Pulse 73   Temp 98.6 F (37 C) (Oral)   Resp (!) 23   SpO2 96%   Physical Exam Constitutional:      General: He is not in acute distress.    Appearance: Normal appearance. He is well-developed. He is not ill-appearing or diaphoretic.     Comments: Unkempt  HENT:     Head: Normocephalic and atraumatic.     Right Ear: External ear normal.     Left Ear: External ear normal.     Nose: Nose normal.  Eyes:     General: Vision grossly intact. Gaze aligned appropriately.     Pupils: Pupils are equal, round, and reactive to light.  Neck:     Musculoskeletal: Normal range of motion.  Trachea: Trachea and phonation normal. No tracheal deviation.  Pulmonary:     Effort: Pulmonary effort is normal. No respiratory distress.  Chest:     Chest wall: No tenderness.  Abdominal:     General: There is no distension.     Palpations: Abdomen is soft.     Tenderness: There is no abdominal tenderness. There is no guarding or rebound.  Musculoskeletal: Normal range of motion.     Comments: No midline C/T/L spinal tenderness to palpation, no deformity, crepitus, or step-off noted. No sign of injury to the neck or back.   Skin:    General:  Skin is warm and dry.  Neurological:     Mental Status: He is alert.     GCS: GCS eye subscore is 4. GCS verbal subscore is 5. GCS motor subscore is 6.     Comments: Speech is clear and goal oriented, follows commands Major Cranial nerves without deficit, no facial droop Normal strength in upper and lower extremities bilaterally including dorsiflexion and plantar flexion, strong and equal grip strength Sensation normal to light touch DTR 2+ bilateral patella Moves extremities without ataxia, coordination intact Normal finger to nose and rapid alternating movements Neg romberg, no pronator drift Normal gait  Psychiatric:        Attention and Perception: He does not perceive auditory or visual hallucinations.        Behavior: Behavior normal. Behavior is not aggressive. Behavior is cooperative.        Thought Content: Thought content does not include homicidal or suicidal ideation. Thought content does not include homicidal or suicidal plan.      ED Treatments / Results  Labs (all labs ordered are listed, but only abnormal results are displayed) Labs Reviewed  COMPREHENSIVE METABOLIC PANEL - Abnormal; Notable for the following components:      Result Value   Glucose, Bld 108 (*)    BUN 5 (*)    All other components within normal limits  CBC WITH DIFFERENTIAL/PLATELET  CK  ETHANOL  URINALYSIS, ROUTINE W REFLEX MICROSCOPIC    EKG EKG Interpretation  Date/Time:  Wednesday Nov 20 2018 15:44:45 EDT Ventricular Rate:  71 PR Interval:    QRS Duration: 118 QT Interval:  394 QTC Calculation: 429 R Axis:   -87 Text Interpretation:  Sinus rhythm Left anterior fascicular block ST elev, probable normal early repol pattern no significant change since Nov 15 2018 Confirmed by Pricilla Loveless [phone]) on 11/20/2018 4:19:51 PM   Radiology No results found.  Procedures Procedures (including critical care time)  Medications Ordered in ED Medications  sodium chloride 0.9 % bolus 1,000  mL (0 mLs Intravenous Stopped 11/20/18 1652)     Initial Impression / Assessment and Plan / ED Course  I have reviewed the triage vital signs and the nursing notes.  Pertinent labs & imaging results that were available during my care of the patient were reviewed by me and considered in my medical decision making (see chart for details).    23 year old male with history of schizophrenia presents today for body aches.  Multiple visits in the past week to ER.  He is without focal pain, fever/chills, nausea/vomiting, diarrhea, chest pain or shortness of breath.  He has generalized body aches without focal area of pain and no other infectious-like symptoms.  Patient ports that he has not been taking his medications as prescribed however he denies auditory or visual hallucinations.  Additionally he denies SI/HI.  Vital signs arrival.  As patient  is homeless and feels dehydrated with body aches will obtain screening lab work.  CBC within normal limits CMP with glucose of 108, otherwise unremarkable Ethanol level negative CK within normal limits Urinalysis is to be collected  EKG reviewed with Dr. Gwenlyn FudgeGoldstein without change from prior  Fluid bolus given - Informed by RN that patient wishes to leave.  Urinalysis still needs to be collected however patient does not wish to provide one at this time, he clinically does not look dehydrated and has been given fluid bolus today he has no urinary symptoms so low suspicion for UTI at this time.  Patient is alert and oriented, denies SI/HI or hallucinations he appears stable for discharge.  Patient does not appear to be a danger to himself or others.  He states that he received a phone call and now must leave as his friend is coming to pick him up.  No further work-up indicated at this time.  Vital signs remained stable throughout visit.  At this time there does not appear to be any evidence of an acute emergency medical condition and the patient appears stable  for discharge with appropriate outpatient follow up. Diagnosis was discussed with patient who verbalizes understanding of care plan and is agreeable to discharge. I have discussed return precautions with patient who verbalizes understanding of return precautions. Patient encouraged to follow-up with their PCP. All questions answered.  Patient has been discharged in good condition.    Note: Portions of this report may have been transcribed using voice recognition software. Every effort was made to ensure accuracy; however, inadvertent computerized transcription errors may still be present. Final Clinical Impressions(s) / ED Diagnoses   Final diagnoses:  Body aches    ED Discharge Orders    None       Elizabeth PalauMorelli, Elton Catalano A, PA-C 11/20/18 1735    Pricilla LovelessGoldston, Scott, MD 11/21/18 [phone]

## 2018-11-20 NOTE — ED Notes (Signed)
Patient verbalizes understanding of discharge instructions. Opportunity for questioning and answers were provided. Armband removed by staff, pt discharged from ED. Ambulated out to lobby  

## 2018-11-20 NOTE — ED Triage Notes (Signed)
Pt in c/o back pain and body aches, seen for the same on the 4th, no distress noted

## 2018-11-20 NOTE — Discharge Instructions (Addendum)
You have been diagnosed today with Body Aches  At this time there does not appear to be the presence of an emergent medical condition, however there is always the potential for conditions to change. Please read and follow the below instructions.  Please return to the Emergency Department immediately for any new or worsening symptoms. Please be sure to follow up with your Primary Care Provider within one week regarding your visit today; please call their office to schedule an appointment even if you are feeling better for a follow-up visit. Please follow-up with your behavioral health specialist and [facility] for medication management and reevaluation.  Call their office today to schedule an appointment. Please drink plenty water and get plenty of rest to help with your symptoms.  Drinking plenty water is important to avoid dehydration.  Get help right away if: You have trouble breathing. You have chest pain You have thoughts of hurting yourself or others. You have a severe headache or a stiff neck. You have severe vomiting or abdominal pain. Any new/concerning or worsening symptoms  Please read the additional information packets attached to your discharge summary.

## 2019-10-25 ENCOUNTER — Other Ambulatory Visit: Payer: Self-pay

## 2019-10-25 ENCOUNTER — Encounter (HOSPITAL_COMMUNITY): Payer: Self-pay | Admitting: Emergency Medicine

## 2019-10-25 ENCOUNTER — Emergency Department (HOSPITAL_COMMUNITY)
Admission: EM | Admit: 2019-10-25 | Discharge: 2019-10-25 | Disposition: A | Discharge status: Home / Self Care | Payer: No Typology Code available for payment source | Attending: Emergency Medicine | Admitting: Emergency Medicine

## 2019-10-25 DIAGNOSIS — J45909 Unspecified asthma, uncomplicated: Secondary | ICD-10-CM | POA: Insufficient documentation

## 2019-10-25 DIAGNOSIS — F17210 Nicotine dependence, cigarettes, uncomplicated: Secondary | ICD-10-CM | POA: Insufficient documentation

## 2019-10-25 DIAGNOSIS — F259 Schizoaffective disorder, unspecified: Secondary | ICD-10-CM | POA: Diagnosis not present

## 2019-10-25 DIAGNOSIS — F419 Anxiety disorder, unspecified: Secondary | ICD-10-CM | POA: Insufficient documentation

## 2019-10-25 DIAGNOSIS — Z046 Encounter for general psychiatric examination, requested by authority: Secondary | ICD-10-CM | POA: Diagnosis present

## 2019-10-25 NOTE — ED Triage Notes (Addendum)
Pt states he needs an [place] to check his baby.  Reports he felt heartbeat in L side of abd last night.  Pt laughing and reluctant to give information.  States his friends have been picking on him about it.  Denies SI/HI.  Pt told registration that he needs to check in for decreased fetal movement.

## 2019-10-25 NOTE — Discharge Instructions (Signed)
You are not pregnant.  Follow up with your doctor for further care.

## 2019-10-25 NOTE — ED Provider Notes (Signed)
[place] EMERGENCY DEPARTMENT Provider Note   CSN: [id number] Arrival date & time: 10/25/19  1015     History Chief Complaint  Patient presents with  . Psychiatric Evaluation    Dalton Rosario is a 24 y.o. male.  The history is provided by the patient. No language interpreter was used.     24 year old male with history of schizophrenia, ADHD, anxiety, depression, presenting requesting to be assessed for pregnancy.  Patient is a male born male who reports he felt movement and heartbeat and has left side abdomen.  He is convinced that he is pregnant and have a baby in his belly.  He did not quantify it how long he has noticed the movement in his belly.  He has not had a pregnancy test.  He denies SI or HI.  He is convinced that he is pregnant and states that he has had anal sex in the past.  He denies any nausea vomiting diarrhea, no dysuria.  No abdominal pain.  Past Medical History:  Diagnosis Date  . ADHD (attention deficit hyperactivity disorder)   . Anxiety   . Asthma   . Depression   . Schizo-affective schizophrenia, chronic condition ([place]) 2019    Patient Active Problem List   Diagnosis Date Noted  . Pneumomediastinum ([place]) 10/26/2018  . Toxic encephalopathy 10/26/2018  . Hypokalemia 10/26/2018  . PTSD (post-traumatic stress disorder) 06/20/2012  . 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 APPENDECTOMY     Pt was 24 yo       No family history on file.  Social History   Tobacco Use  . Smoking status: Current Every Day Smoker    Packs/day: 0.50    Types: Cigarettes  . Smokeless tobacco: Never Used  Substance Use Topics  . Alcohol use: No  . Drug use: No    Home Medications Prior to Admission medications   Not on File    Allergies    Patient has no known allergies.  Review of Systems   Review of Systems  All other systems reviewed and are  negative.   Physical Exam Updated Vital Signs BP (!) 143/80 (BP Location: Left Arm)   Pulse 84   Temp 98.1 F (36.7 C) (Oral)   Resp 14   Ht 5\' 7"  (1.702 m)   Wt 90.7 kg   SpO2 97%   BMI 31.32 kg/m   Physical Exam Vitals and nursing note reviewed.  Constitutional:      General: He is not in acute distress.    Appearance: He is well-developed.     Comments: Patient resting comfortably in no acute distress.  HENT:     Head: Atraumatic.     Comments: Full beard noted Eyes:     Conjunctiva/sclera: Conjunctivae normal.  Abdominal:     General: Abdomen is flat.     Palpations: Abdomen is soft.     Tenderness: There is no abdominal tenderness.  Musculoskeletal:     Cervical back: Neck supple.  Skin:    Findings: No rash.  Neurological:     Mental Status: He is alert.  Psychiatric:        Behavior: Behavior is cooperative.        Thought Content: Thought content does not include homicidal or suicidal ideation.     Comments: Patient laying in bed, giggling during my interview     ED Results / Procedures / Treatments  Labs (all labs ordered are listed, but only abnormal results are displayed) Labs Reviewed - No data to display  EKG None  Radiology No results found.  Procedures Procedures (including critical care time)  Medications Ordered in ED Medications - No data to display  ED Course  I have reviewed the triage vital signs and the nursing notes.  Pertinent labs & imaging results that were available during my care of the patient were reviewed by me and considered in my medical decision making (see chart for details).    MDM Rules/Calculators/A&P                      BP (!) 143/80 (BP Location: Left Arm)   Pulse 84   Temp 98.1 F (36.7 C) (Oral)   Resp 14   Ht 5\' 7"  (1.702 m)   Wt 90.7 kg   SpO2 97%   BMI 31.32 kg/m   Final Clinical Impression(s) / ED Diagnoses Final diagnoses:  Anxiety    Rx / DC Orders ED Discharge Orders    None      11:00 AM Patient here concerned that he is pregnant because he felt movement in his abdomen.  He does have known history of psychiatric illness including schizophrenia.  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  Reassurance given.  No SI HI.  Recommend outpatient follow-up with his provider for further care.   , PA-C 10/25/19 1103    [place], Adam, DO 10/25/19 1540

## 2019-10-25 NOTE — ED Notes (Signed)
Pt states "feel right here-- you can feel something moving-He told me I am not pregnant" reiterated that also-- pt tearful when he realized he was not pregnant. Asked if he could do a pregnancy test, if it would show anything. This nurse explained that he could get one at the store. D/c'd ambulatory

## 2019-11-24 IMAGING — DX PORTABLE CHEST - 1 VIEW
1 series · 1 of 1 positions shown · non-contrast
Comparison: None.

CLINICAL DATA: Pneumomediastinum.

EXAM:
PORTABLE CHEST 1 VIEW

[chest ap]
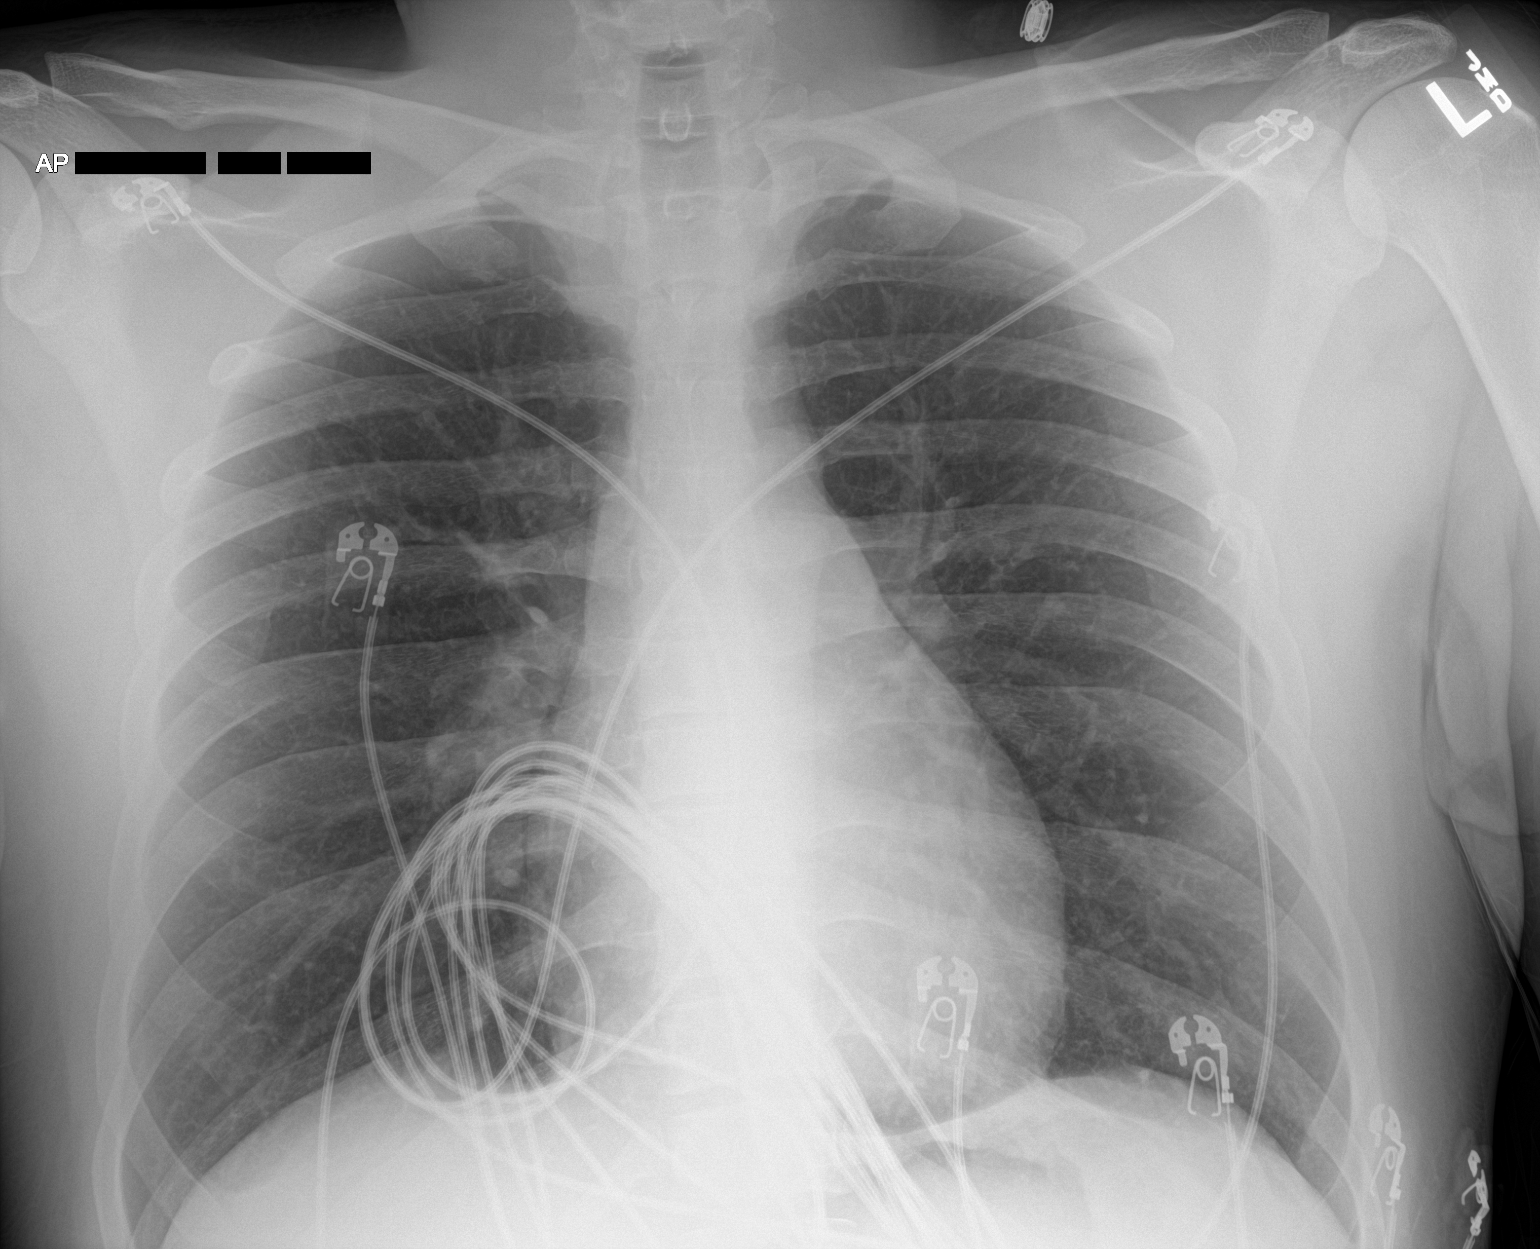

[1 of 1 positions shown; findings below may reference images not displayed]

FINDINGS: The heart size is normal. No residual pneumomediastinum is present.
Previously seen subcutaneous emphysema has resolved as well. The
lungs are clear. There is no edema or effusion.
IMPRESSION: 1. Interval resolution of pneumomediastinum and subcutaneous
emphysema.
2. Normal one-view chest x-ray

## 2019-11-27 IMAGING — DX PORTABLE CHEST - 1 VIEW
1 series · 1 of 1 positions shown · non-contrast
Comparison: 11/12/2018

CLINICAL DATA: Weakness over the past week with chills over the
past 2 days. Bipolar and schizophrenia.

EXAM:
PORTABLE CHEST 1 VIEW

[chest ap]
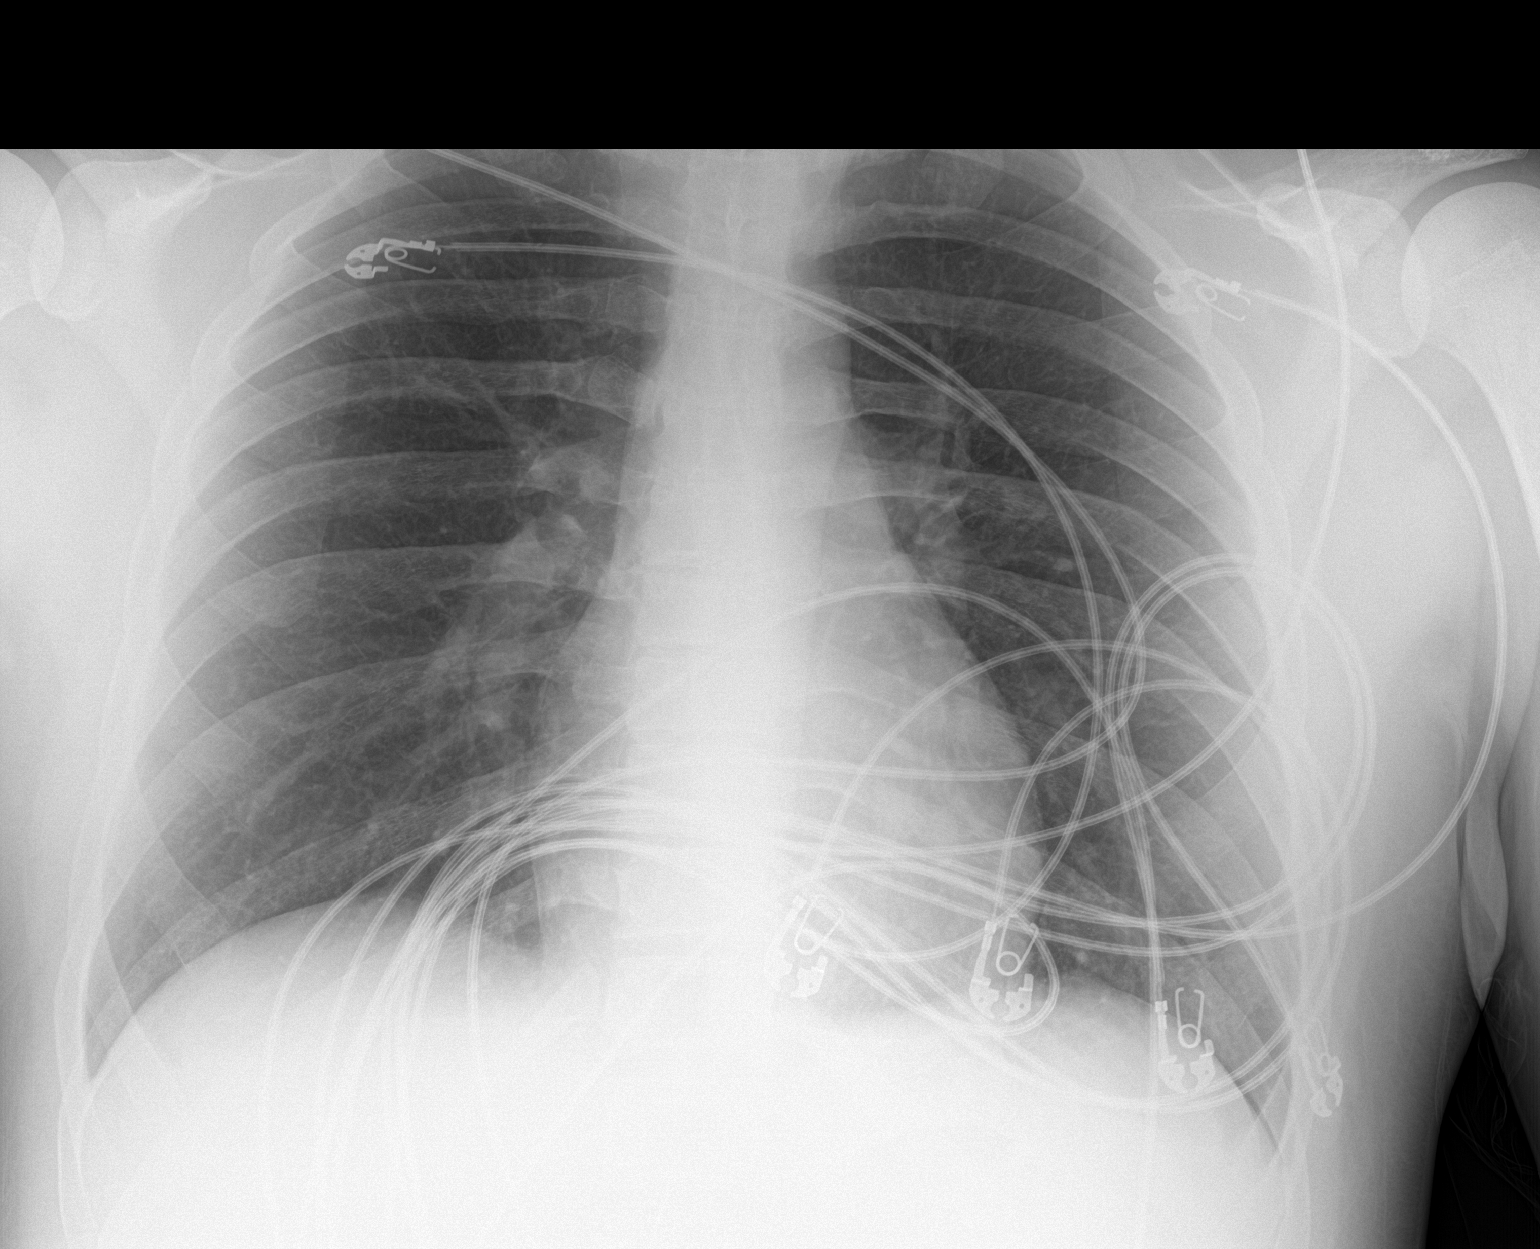

[1 of 1 positions shown; findings below may reference images not displayed]

FINDINGS: Lungs are adequately inflated and otherwise clear. Cardiomediastinal
silhouette, bones and soft tissues are normal.
IMPRESSION: No active disease.

## 2019-12-10 ENCOUNTER — Ambulatory Visit (INDEPENDENT_AMBULATORY_CARE_PROVIDER_SITE_OTHER): Payer: Medicaid Other | Admitting: Primary Care

## 2020-05-04 ENCOUNTER — Ambulatory Visit (INDEPENDENT_AMBULATORY_CARE_PROVIDER_SITE_OTHER): Payer: Medicaid Other | Admitting: Primary Care

## 2020-08-08 ENCOUNTER — Encounter (HOSPITAL_COMMUNITY): Payer: Self-pay | Admitting: Obstetrics and Gynecology

## 2020-08-08 ENCOUNTER — Emergency Department (HOSPITAL_COMMUNITY)
Admission: EM | Admit: 2020-08-08 | Discharge: 2020-08-08 | Disposition: A | Discharge status: Home / Self Care | Payer: Medicaid Other | Attending: Emergency Medicine | Admitting: Emergency Medicine

## 2020-08-08 ENCOUNTER — Other Ambulatory Visit: Payer: Self-pay

## 2020-08-08 ENCOUNTER — Ambulatory Visit (HOSPITAL_COMMUNITY)
Admission: EM | Admit: 2020-08-08 | Discharge: 2020-08-08 | Discharge status: Absent without leave | Payer: No Typology Code available for payment source

## 2020-08-08 DIAGNOSIS — F17210 Nicotine dependence, cigarettes, uncomplicated: Secondary | ICD-10-CM | POA: Insufficient documentation

## 2020-08-08 DIAGNOSIS — Z046 Encounter for general psychiatric examination, requested by authority: Secondary | ICD-10-CM | POA: Insufficient documentation

## 2020-08-08 DIAGNOSIS — J45909 Unspecified asthma, uncomplicated: Secondary | ICD-10-CM | POA: Insufficient documentation

## 2020-08-08 DIAGNOSIS — Z8659 Personal history of other mental and behavioral disorders: Secondary | ICD-10-CM

## 2020-08-08 DIAGNOSIS — F209 Schizophrenia, unspecified: Secondary | ICD-10-CM | POA: Diagnosis not present

## 2020-08-08 LAB — COMPREHENSIVE METABOLIC PANEL
ALT: 23 U/L (ref 0–44)
AST: 17 U/L (ref 15–41)
Albumin: 4.6 g/dL (ref 3.5–5.0)
Alkaline Phosphatase: 69 U/L (ref 38–126)
Anion gap: 7 (ref 5–15)
BUN: 7 mg/dL (ref 6–20)
CO2: 30 mmol/L (ref 22–32)
Calcium: 9.8 mg/dL (ref 8.9–10.3)
Chloride: 103 mmol/L (ref 98–111)
Creatinine, Ser: 0.94 mg/dL (ref 0.61–1.24)
GFR, Estimated: >60 mL/min (ref >60)
Glucose, Bld: 104 mg/dL — ABNORMAL HIGH (ref 70–99)
Potassium: 3.7 mmol/L (ref 3.5–5.1)
Sodium: 140 mmol/L (ref 135–145)
Total Bilirubin: 0.6 mg/dL (ref 0.3–1.2)
Total Protein: 8.6 g/dL — ABNORMAL HIGH (ref 6.5–8.1)

## 2020-08-08 LAB — ETHANOL: Alcohol, Ethyl (B): <10 mg/dL (ref <10)

## 2020-08-08 LAB — CBC
HCT: 44.4 % (ref 39.0–52.0)
Hemoglobin: 15.3 g/dL (ref 13.0–17.0)
MCH: 30.9 pg (ref 26.0–34.0)
MCHC: 34.5 g/dL (ref 30.0–36.0)
MCV: 89.7 fL (ref 80.0–100.0)
Platelets: 236 10*3/uL (ref 150–400)
RBC: 4.95 MIL/uL (ref 4.22–5.81)
RDW: 12.1 % (ref 11.5–15.5)
WBC: 9.7 10*3/uL (ref 4.0–10.5)
nRBC: 0 % (ref 0.0–0.2)

## 2020-08-08 LAB — RAPID URINE DRUG SCREEN, HOSP PERFORMED
Amphetamines: NOT DETECTED
Barbiturates: NOT DETECTED
Benzodiazepines: NOT DETECTED
Cocaine: NOT DETECTED
Opiates: NOT DETECTED
Tetrahydrocannabinol: NOT DETECTED

## 2020-08-08 LAB — SALICYLATE LEVEL: Salicylate Lvl: <7 mg/dL — ABNORMAL LOW (ref 7.0–30.0)

## 2020-08-08 LAB — ACETAMINOPHEN LEVEL: Acetaminophen (Tylenol), Serum: <10 ug/mL — ABNORMAL LOW (ref 10–30)

## 2020-08-08 NOTE — ED Provider Notes (Addendum)
Behavioral Health Urgent Care Medical Screening Exam  Patient Name: Dalton Rosario MRN: [id number] Date of Evaluation: 08/08/20 Chief Complaint:  IVC Diagnosis:  Final diagnoses:  None    History of Present illness: Dalton Rosario is a 25 y.o. male with documented history of PTSD, ADHD who presents to the behavioral health urgent care under IVC by law enforcement.  Patient was just released from jail today and had been in jail since September 2021. Due to patient's documented history of exhibiting physically aggressive behavior and IVC paperwork, it was determined that patient did not meet appropriate criteria for [facility] assessment.  Patient was still in police custody at this time and custody had not been transferred over to the behavioral health urgent care.  Law enforcement agreed to take the patient to the emergency department for further assessment due to patient not meeting appropriate [facility] assessment criteria.  Psychiatric Specialty Exam  Presentation  General Appearance:Disheveled (In handcuffs)  Eye Contact:Fair  Speech:Normal Rate (Not assessed)  Speech Volume:Normal  Handedness:No data recorded  Mood and Affect  Mood:-- (Unable to be assessed)  Affect:-- (Unable to be assessed.)   Thought Process  Thought Processes:-- (Unable to be assessed.)  Descriptions of Associations:-- (Unable to be assessed.)  Orientation:-- (Unable to be assessed.)  Thought Content:-- (Unable to be assessed.)  Hallucinations:-- (Unable to be assessed.)  Ideas of Reference:-- (Unable to be assessed.)  Suicidal Thoughts:-- (Unable to be assessed.)  Homicidal Thoughts:-- (Unable to be assessed.)   Sensorium  Memory:-- (Unable to be assessed.)  Judgment:-- (Unable to be assessed.)  Insight:-- (Unable to be assessed.)   Executive Functions  Concentration:-- (Unable to be assessed.)  Attention Span:Fair  Recall:-- (Unable to be assessed.)  Fund of Knowledge:-- (Unable to  be assessed.)  Language:Fair   Psychomotor Activity  Psychomotor Activity:Normal   Assets  Assets:Other (comment) (Unable to be assessed.)   Sleep  Sleep:No data recorded Number of hours: No data recorded  Physical Exam: Physical Exam Neurological:     Mental Status: He is alert.   Remainder of physical exam not conducted due to patient not meeting [facility] assessment criteria. ROS  ROS not conducted due to patient not meeting [facility] assessment criteria. There were no vitals taken for this visit. There is no height or weight on file to calculate BMI.  Musculoskeletal: Strength & Muscle Tone: within normal limits Gait & Station: normal Patient leans: N/A   [facility] MSE Discharge Disposition for Follow up and Recommendations: Based on my evaluation, the patient does not appear to have emergency medical condition but recommend the patient is transferred to the emergency department for further evaluation due to patient not meeting criteria for behavioral health urgent care assessment due to history of physically aggressive and threatening behaviors.  Due to patient's documented history of exhibiting physically aggressive behavior and IVC paperwork, it was determined that patient did not meet appropriate criteria for [facility] assessment.  Patient was still in police custody at this time and custody had not been transferred over to the behavioral health urgent care.  Law enforcement agreed to take the patient to the emergency department for further assessment due to patient not meeting appropriate [facility] assessment criteria.   Jaclyn Shaggy, PA-C 08/08/2020, 8:30 AM

## 2020-08-08 NOTE — ED Notes (Signed)
Patient has one bag of labeled belongings at nurses station in triage.
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
## 2020-08-08 NOTE — Discharge Instructions (Addendum)
It was our pleasure to provide your ER care today - we hope that you feel better.  Our behavioral health team indicates for you to follow up at our Behavioral Health Urgent Care [facility]) - you may call for appointment, and walk-ins are also welcome. For behavioral health issues and/or crisis (for example, hallucinations, delusions, severe anxiety or depression, thoughts of harm to self or others), you may go directly to the [facility], it is open 24/7.   Also follow up with primary care doctor in the next few weeks.   Return to ER if worse, new symptoms, fevers, chest pain, trouble breathing, or other concern.
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
## 2020-08-09 ENCOUNTER — Telehealth (HOSPITAL_COMMUNITY): Payer: Self-pay | Admitting: General Practice

## 2020-08-09 NOTE — Telephone Encounter (Signed)
Care Management - Follow Up [facility] Discharges   Writer attempted to make contact with patient today and was unsuccessful.  Writer was able to leave a HIPPA compliant voice message and will await callback.   

## 2020-08-16 ENCOUNTER — Encounter (HOSPITAL_COMMUNITY): Payer: Self-pay

## 2020-08-16 ENCOUNTER — Other Ambulatory Visit: Payer: Self-pay

## 2020-08-16 DIAGNOSIS — Z046 Encounter for general psychiatric examination, requested by authority: Secondary | ICD-10-CM | POA: Insufficient documentation

## 2020-08-16 DIAGNOSIS — Z5900 Homelessness unspecified: Secondary | ICD-10-CM | POA: Diagnosis not present

## 2020-08-16 DIAGNOSIS — J45909 Unspecified asthma, uncomplicated: Secondary | ICD-10-CM | POA: Insufficient documentation

## 2020-08-16 DIAGNOSIS — F2089 Other schizophrenia: Secondary | ICD-10-CM | POA: Insufficient documentation

## 2020-08-16 DIAGNOSIS — F17210 Nicotine dependence, cigarettes, uncomplicated: Secondary | ICD-10-CM | POA: Insufficient documentation
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
## 2020-08-17 ENCOUNTER — Emergency Department (HOSPITAL_COMMUNITY)
Admission: EM | Admit: 2020-08-17 | Discharge: 2020-08-17 | Disposition: A | Discharge status: Home / Self Care | Payer: Medicaid Other | Attending: Emergency Medicine | Admitting: Emergency Medicine

## 2020-08-17 DIAGNOSIS — F2089 Other schizophrenia: Secondary | ICD-10-CM

## 2020-08-17 LAB — RAPID URINE DRUG SCREEN, HOSP PERFORMED
Amphetamines: NOT DETECTED
Barbiturates: NOT DETECTED
Benzodiazepines: NOT DETECTED
Cocaine: NOT DETECTED
Opiates: NOT DETECTED
Tetrahydrocannabinol: POSITIVE — AB

## 2020-08-17 LAB — CBC WITH DIFFERENTIAL/PLATELET
Abs Immature Granulocytes: 0.02 10*3/uL (ref 0.00–0.07)
Basophils Absolute: 0 10*3/uL (ref 0.0–0.1)
Basophils Relative: 0 %
Eosinophils Absolute: 0.1 10*3/uL (ref 0.0–0.5)
Eosinophils Relative: 1 %
HCT: 38.5 % — ABNORMAL LOW (ref 39.0–52.0)
Hemoglobin: 12.8 g/dL — ABNORMAL LOW (ref 13.0–17.0)
Immature Granulocytes: 0 %
Lymphocytes Relative: 26 %
Lymphs Abs: 2.1 10*3/uL (ref 0.7–4.0)
MCH: 30.6 pg (ref 26.0–34.0)
MCHC: 33.2 g/dL (ref 30.0–36.0)
MCV: 92.1 fL (ref 80.0–100.0)
Monocytes Absolute: 0.8 10*3/uL (ref 0.1–1.0)
Monocytes Relative: 9 %
Neutro Abs: 5.2 10*3/uL (ref 1.7–7.7)
Neutrophils Relative %: 64 %
Platelets: 281 10*3/uL (ref 150–400)
RBC: 4.18 MIL/uL — ABNORMAL LOW (ref 4.22–5.81)
RDW: 13.4 % (ref 11.5–15.5)
WBC: 8.2 10*3/uL (ref 4.0–10.5)
nRBC: 0 % (ref 0.0–0.2)

## 2020-08-17 LAB — ETHANOL: Alcohol, Ethyl (B): <10 mg/dL (ref <10)

## 2020-08-17 LAB — BASIC METABOLIC PANEL
Anion gap: 10 (ref 5–15)
BUN: 12 mg/dL (ref 6–20)
CO2: 25 mmol/L (ref 22–32)
Calcium: 8.9 mg/dL (ref 8.9–10.3)
Chloride: 106 mmol/L (ref 98–111)
Creatinine, Ser: 0.76 mg/dL (ref 0.61–1.24)
GFR, Estimated: >60 mL/min (ref >60)
Glucose, Bld: 87 mg/dL (ref 70–99)
Potassium: 3.4 mmol/L — ABNORMAL LOW (ref 3.5–5.1)
Sodium: 141 mmol/L (ref 135–145)

## 2020-08-17 NOTE — ED Notes (Signed)
Called transport for ride to [facility]
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
## 2020-08-17 NOTE — BH Assessment (Signed)
BHH Assessment Progress Note  Per Shuvon Rankin, NP, pt is to be is transferred to the [facility].  Please call report to [phone].  Pt is to be transported via [employer].  EDP Arby Barrette, MD and pt's nurses, [place] and [place], have been notified.  Doylene Canning, [place] Behavioral Health Coordinator [phone]

## 2020-08-17 NOTE — ED Notes (Signed)
Patient ambulatory without assistance to restroom. Patient also able to tolerate PO. Given sprite and a sandwich,

## 2020-08-17 NOTE — ED Notes (Signed)
Patient provided with sandwich and drink. Patient too somnolent to eat at this time.

## 2020-08-17 NOTE — ED Notes (Signed)
Patient ambulatory to the restroom independently
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
## 2020-08-17 NOTE — ED Notes (Signed)
Patient stated he wants to leave, he refuses to go to [facility]. Patient is voluntary.

## 2020-08-17 NOTE — ED Notes (Signed)
Report called to [facility].

## 2020-12-15 ENCOUNTER — Telehealth: Payer: Self-pay

## 2020-12-15 NOTE — Telephone Encounter (Signed)
Request from [facility] for appointment for patient to establish care with PCP.  Scheduled him with Dr Alvis Lemmings 01/31/2021.  Patient is homeless and stays outside.  Marylene Land Parker/  Case Manager with [facility] has been working with the patient and as per Eunice Blase, the [employer] keeps Marylene Land updated on where the patient is staying. Marylene Land will accompany the patient to his appointment

## 2021-01-31 ENCOUNTER — Ambulatory Visit: Payer: Self-pay | Admitting: Family Medicine

## 2021-02-17 ENCOUNTER — Emergency Department (HOSPITAL_COMMUNITY)
Admission: EM | Admit: 2021-02-17 | Discharge: 2021-02-18 | Disposition: A | Discharge status: Other Acute Inpatient Hospital | Payer: Medicaid Other | Attending: Emergency Medicine | Admitting: Emergency Medicine

## 2021-02-17 ENCOUNTER — Other Ambulatory Visit: Payer: Self-pay

## 2021-02-17 ENCOUNTER — Encounter (HOSPITAL_COMMUNITY): Payer: Self-pay

## 2021-02-17 ENCOUNTER — Emergency Department (HOSPITAL_COMMUNITY): Payer: Medicaid Other

## 2021-02-17 DIAGNOSIS — Y900 Blood alcohol level of less than 20 mg/100 ml: Secondary | ICD-10-CM | POA: Insufficient documentation

## 2021-02-17 DIAGNOSIS — J45909 Unspecified asthma, uncomplicated: Secondary | ICD-10-CM | POA: Diagnosis not present

## 2021-02-17 DIAGNOSIS — F29 Unspecified psychosis not due to a substance or known physiological condition: Secondary | ICD-10-CM | POA: Insufficient documentation

## 2021-02-17 DIAGNOSIS — S0012XA Contusion of left eyelid and periocular area, initial encounter: Secondary | ICD-10-CM | POA: Insufficient documentation

## 2021-02-17 DIAGNOSIS — S0011XA Contusion of right eyelid and periocular area, initial encounter: Secondary | ICD-10-CM | POA: Diagnosis not present

## 2021-02-17 DIAGNOSIS — F17210 Nicotine dependence, cigarettes, uncomplicated: Secondary | ICD-10-CM | POA: Diagnosis not present

## 2021-02-17 DIAGNOSIS — S0591XA Unspecified injury of right eye and orbit, initial encounter: Secondary | ICD-10-CM | POA: Diagnosis present

## 2021-02-17 DIAGNOSIS — Z20822 Contact with and (suspected) exposure to covid-19: Secondary | ICD-10-CM | POA: Diagnosis not present

## 2021-02-17 DIAGNOSIS — W228XXA Striking against or struck by other objects, initial encounter: Secondary | ICD-10-CM | POA: Insufficient documentation

## 2021-02-17 DIAGNOSIS — F209 Schizophrenia, unspecified: Secondary | ICD-10-CM | POA: Diagnosis not present

## 2021-02-17 LAB — COMPREHENSIVE METABOLIC PANEL
ALT: 16 U/L (ref 0–44)
AST: 16 U/L (ref 15–41)
Albumin: 3.8 g/dL (ref 3.5–5.0)
Alkaline Phosphatase: 68 U/L (ref 38–126)
Anion gap: 4 — ABNORMAL LOW (ref 5–15)
BUN: 8 mg/dL (ref 6–20)
CO2: 27 mmol/L (ref 22–32)
Calcium: 8.8 mg/dL — ABNORMAL LOW (ref 8.9–10.3)
Chloride: 108 mmol/L (ref 98–111)
Creatinine, Ser: 0.8 mg/dL (ref 0.61–1.24)
GFR, Estimated: >60 mL/min (ref >60)
Glucose, Bld: 97 mg/dL (ref 70–99)
Potassium: 3.9 mmol/L (ref 3.5–5.1)
Sodium: 139 mmol/L (ref 135–145)
Total Bilirubin: 0.7 mg/dL (ref 0.3–1.2)
Total Protein: 7 g/dL (ref 6.5–8.1)

## 2021-02-17 LAB — ETHANOL: Alcohol, Ethyl (B): <10 mg/dL (ref <10)

## 2021-02-17 LAB — SALICYLATE LEVEL: Salicylate Lvl: <7 mg/dL — ABNORMAL LOW (ref 7.0–30.0)

## 2021-02-17 LAB — RAPID URINE DRUG SCREEN, HOSP PERFORMED
Amphetamines: NOT DETECTED
Barbiturates: NOT DETECTED
Benzodiazepines: NOT DETECTED
Cocaine: NOT DETECTED
Opiates: NOT DETECTED
Tetrahydrocannabinol: POSITIVE — AB

## 2021-02-17 LAB — CBC
HCT: 41.8 % (ref 39.0–52.0)
Hemoglobin: 14 g/dL (ref 13.0–17.0)
MCH: 31.2 pg (ref 26.0–34.0)
MCHC: 33.5 g/dL (ref 30.0–36.0)
MCV: 93.1 fL (ref 80.0–100.0)
Platelets: 252 10*3/uL (ref 150–400)
RBC: 4.49 MIL/uL (ref 4.22–5.81)
RDW: 13.4 % (ref 11.5–15.5)
WBC: 7.9 10*3/uL (ref 4.0–10.5)
nRBC: 0 % (ref 0.0–0.2)

## 2021-02-17 LAB — ACETAMINOPHEN LEVEL: Acetaminophen (Tylenol), Serum: <10 ug/mL — ABNORMAL LOW (ref 10–30)

## 2021-02-17 MED ORDER — ZIPRASIDONE MESYLATE 20 MG IM SOLR
20.0000 mg | Freq: Once | INTRAMUSCULAR | Status: AC
  Administered 2021-02-17: 20 mg via INTRAMUSCULAR

## 2021-02-17 MED ORDER — ARIPIPRAZOLE 5 MG PO TABS
5.0000 mg | ORAL_TABLET | Freq: Every day | ORAL | Status: DC
  Administered 2021-02-17 – 2021-02-18 (×2): 5 mg via ORAL
  Filled 2021-02-17 (×2): qty 1

## 2021-02-17 NOTE — Progress Notes (Signed)
Checked on pt while using the restroom because he was yelling. Asked pt why he was yelling he stated " There is little boy in my head and wont get the fuck out "

## 2021-02-17 NOTE — BH Assessment (Addendum)
[facility] Assessment Progress Note    Per [name], NP, this pt requires psychiatric hospitalization at this time.  Pt presents under IVC initiated by a clinician and upheld by EDP Rolan Bucco, MD.  At the direction of Nelly Rout, MD at 14:36 this writer has sought placement for this pt at facilities outside of the [facility] system.  The following facilities have been contacted to seek placement for this pt, with results as noted:   Beds available, information sent, decision pending: [facility]    Unable to Reach: [facility]   At capacity: [facility]     Doylene Canning, [place] Behavioral Health Coordinator [phone]

## 2021-02-17 NOTE — Progress Notes (Addendum)
Pt accepted to [facility]   Patient meets inpatient criteria per [name], NP.   Dr. Estill Cotta is the attending provider.    Call report to [phone]  Hedwig Morton, RN @ [facility] notified via secure chat.   Pt scheduled  to arrive at [facility] at 0800 AM  Signed:  Corky Crafts, MSW, [place], LCASA 02/17/2021 10:10 PM

## 2021-02-17 NOTE — BH Assessment (Addendum)
Comprehensive Clinical Assessment (CCA) Note  02/17/2021 Dalton Rosario [id number]   Disposition: TTS completed. Per [facility] provider, [name], RN, patient meets criteria for inpatient psychiatric treatment. Disposition Counselor, EDP, and nursing provided disposition updates. Disposition Counselor to seek appropriate inpatient psychiatric  placement. [place]-SUICIDE SEVERITY RATING SCALE (C-SSRS) completed and patient scored "No Risk". Therefore, no 1-1 sitter recommendations noted at this time.   The patient demonstrates the following risk factors for suicide: Chronic risk factors for suicide include: psychiatric disorder of Bipolar Disorder . Acute risk factors for suicide include:  medication non compliance and reportedly "off track with his medications" . Protective factors for this patient include: positive social support, positive therapeutic relationship, and hope for the future. Considering these factors, the overall suicide risk at this point appears to be "No Risk". Patient is not appropriate for outpatient follow up.   Flowsheet Row ED from 02/17/2021 in [name] from 08/17/2020 in [name] from 08/08/2020 in [name]  C-SSRS RISK CATEGORY No Risk No Risk Error: Q7 should not be populated when Q6 is No       Chief Complaint:  Chief Complaint  Patient presents with   IVC   Schizophrenia   Visit Diagnosis: Schizophrenia    Patient is 25 yr old male brought in by [employer]. Patient under IVC. Per chart review: "Patient found in the parking deck hallucinating, yelling, and punching the air. Patient has not been tending to hygiene and verbally aggressive and out of tough with reality. He is a danger to himself and others."  Patient denies that he has a mental health history. However, upon chart review their is a noted history of Schizophrenia and similar presentation. Patient is  a poor historian, bizarre, tangential thought processes's, and responds to most questions asked in today's assessment with non sensible statements. However, calm and cooperative.   Denies current suicidal ideations. He reports a history of "several suicide attempts". He does not know when he last attempted suicide. When asked how he as tried to commit suicide in the past he replies: "I can't remember anything". Denies history of self-mutilating behaviors. No access to firearms. He identified "Police" as his biggest stressor. When asked to provide additional information regardin the police. His response was, "I don't  like the way that police talk to people.Marland KitchenMarland KitchenMarland KitchenThey shouldn't do that". Denies depression an anxiety symptom. Denies a family history of mental health illnesses.  He does not have a support system.  When asked about his current living arrangements he states, "I am waiting on my house". Upon chart review he has a history of chronic homelessness. States that he is currently unemployed. Highest level of education is 10th grade.  When asked about history of trauma and/or abuse he doesn't provide a response.   He denies homicidal ideations. Denies a history of aggressive and/or assaultive behaviors. When asked about the verbal aggression as noted today he states, "Yep, I got angry because somone doesn't deserve their house". He reports legal issues stating, "They were moving my wallet around, taking things out my pocket, showing they were the law, if they were the law.... they should have made a better decision".   He has auditory hallucinations described as "different dimensions and sizes". He reports visual hallucinations described as "other peoples dimensions. Patient  appeared to be responding to internal stimuli with delusional thought processes.   Patient reports a history of alcohol, cocaine, THC. He was not  able to provide an sensible responses when asked about his reports use of substances.  Denies history of inpatient psychiatric treatment. Also, denies having a therapist and/or psychiatrist.    CCA Screening, Triage and Referral (STR)  Patient Reported Information How did you hear about us? Legal System  What Is the Reason for Your Visit/Call Today? Patient is 25 yr old male brought in by [employer]. Patient under IVC. States that he was on the side of street. when police found him. Per chart review: "Patient in the parking deck hallucinating, yelling, and punching the air. Patient has not been tending to hygiene and verbally aggressive and out of tough with reality. He is a danger to himself and others."  How Long Has This Been Causing You Problems? > than 6 months  What Do You Feel Would Help You the Most Today? Assessment Only (Pt is unsure if he wants help)   Have You Recently Had Any Thoughts About Hurting Yourself? No  Are You Planning to Commit Suicide/Harm Yourself At This time? No   Have you Recently Had Thoughts About Hurting Someone [name]? No  Are You Planning to Harm Someone at This Time? No  Explanation: No data recorded  Have You Used Any Alcohol or Drugs in the Past 24 Hours? No  How Long Ago Did You Use Drugs or Alcohol? No data recorded What Did You Use and How Much? No data recorded  Do You Currently Have a Therapist/Psychiatrist? No  Name of Therapist/Psychiatrist: No data recorded  Have You Been Recently Discharged From Any Office Practice or Programs? No  Explanation of Discharge From Practice/Program: No data recorded    CCA Screening Triage Referral Assessment Type of Contact: Tele-Assessment  Telemedicine Service Delivery:   Is this Initial or Reassessment? Initial Assessment  Date Telepsych consult ordered in [facility]:  02/17/21  Time Telepsych consult ordered in [facility]:  No data recorded Location of Assessment: [facility]  [facility]   Collateral Involvement: No data recorded  Does Patient Have a Court  Appointed Legal Guardian? No data recorded Name and Contact of Legal Guardian: No data recorded If Minor and Not Living with Parent(s), Who has Custody? No data recorded Is CPS involved or ever been involved? Never  Is APS involved or ever been involved? Never   Patient Determined To Be At Risk for Harm To Self or Others Based on Review of Patient Reported Information or Presenting Complaint? No  Method: No data recorded Availability of Means: No data recorded Intent: No data recorded Notification Required: No data recorded Additional Information for Danger to Others Potential: No data recorded Additional Comments for Danger to Others Potential: No data recorded Are There Guns or Other Weapons in Your Home? No data recorded Types of Guns/Weapons: No data recorded Are These Weapons Safely Secured?                            No data recorded Who Could Verify You Are Able To Have These Secured: No data recorded Do You Have any Outstanding Charges, Pending Court Dates, Parole/Probation? No data recorded Contacted To Inform of Risk of Harm To Self or Others: No data recorded   Does Patient Present under Involuntary Commitment? No  IVC Papers Initial File Date: 08/08/20   [place]   Patient Currently Receiving the Following Services: -- (Patient does not have any services in place at this time.)   Determination  of Need: Emergent (2 hours)   Options For Referral: Medication Management; Inpatient Hospitalization     CCA Biopsychosocial Patient Reported Schizophrenia/Schizoaffective Diagnosis in Past: Yes   Strengths: Some insight   Mental Health Symptoms Depression:   Sleep (too much or little); Increase/decrease in appetite   Duration of Depressive symptoms:  Duration of Depressive Symptoms: Greater than two weeks   Mania:   None   Anxiety:    None   Psychosis:   Hallucinations   Duration of Psychotic symptoms:    Trauma:   None    Obsessions:   None   Compulsions:  No data recorded  Inattention:   None   Hyperactivity/Impulsivity:   N/A   Oppositional/Defiant Behaviors:   None   Emotional Irregularity:   None   Other Mood/Personality Symptoms:  No data recorded   Mental Status Exam Appearance and self-care  Stature:   Average   Weight:   Average weight   Clothing:   Casual   Grooming:   Normal   Cosmetic use:   None   Posture/gait:   Normal   Motor activity:   Not Remarkable   Sensorium  Attention:   Normal   Concentration:   Normal   Orientation:   X5   Recall/memory:   Normal   Affect and Mood  Affect:   Flat   Mood:   Other (Comment)   Relating  Eye contact:   Normal   Facial expression:   Responsive   Attitude toward examiner:   Cooperative   Thought and Language  Speech flow:  Normal   Thought content:   Appropriate to Mood and Circumstances   Preoccupation:  No data recorded  Hallucinations:   Auditory   Organization:  No data recorded  [employer] of Knowledge:   Average   Intelligence:   Average   Abstraction:   Curator; Functional   Judgement:   Fair   Reality Testing:   Adequate   Insight:   Fair   Decision Making:   Only simple   Social Functioning  Social Maturity:   Isolates   Social Judgement:   Naive   Stress  Stressors:   Housing; Work   Coping Ability:   Deficient supports   Skill Deficits:   Self-care; Responsibility   Supports:   Family (Foster parents)     Religion: Religion/Spirituality Are You A Religious Person?: No  Leisure/Recreation: Leisure / Recreation Do You Have Hobbies?: No  Exercise/Diet: Exercise/Diet Do You Exercise?: No Have You Gained or Lost A Significant Amount of Weight in the Past Six Months?: No Do You Follow a Special Diet?: No Do You Have Any Trouble Sleeping?: Yes Explanation of Sleeping Difficulties: Disturbed sleep recently (while in  detention center)   CCA Employment/Education Employment/Work Situation: Employment / Work Situation Employment Situation: Unemployed Patient's Job has Been Impacted by Current Illness: No Has Patient ever Been in Equities trader?: No  Education: Education Is Patient Currently Attending School?: No Last Grade Completed:  (10th grade) Did You Attend College?: No Did You Have An Individualized Education Program (IIEP): No Did You Have Any Difficulty At School?: No Patient's Education Has Been Impacted by Current Illness: No   CCA Family/Childhood History Family and Relationship History: Family history Marital status: Single Does patient have children?: No  Childhood History:  Childhood History By whom was/is the patient raised?: Foster parents Did patient suffer any verbal/emotional/physical/sexual abuse as a child?: No Did patient suffer from severe childhood neglect?:  No Has patient ever been sexually abused/assaulted/raped as an adolescent or adult?: No Was the patient ever a victim of a crime or a disaster?: No Witnessed domestic violence?: No Has patient been affected by domestic violence as an adult?: No  Child/Adolescent Assessment:     CCA Substance Use Alcohol/Drug Use: Alcohol / Drug Use Pain Medications: Please see MAR Prescriptions: Please see MAR Over the Counter: Please see MAR History of alcohol / drug use?: Yes (Patient reports a history of alcohol, cocaine, THC. Patient was a poor historian and today's assesment.) Longest period of sobriety (when/how long): NA Negative Consequences of Use:  (unknown) Withdrawal Symptoms: None                         ASAM's:  Six Dimensions of Multidimensional Assessment  Dimension 1:  Acute Intoxication and/or Withdrawal Potential:      Dimension 2:  Biomedical Conditions and Complications:      Dimension 3:  Emotional, Behavioral, or Cognitive Conditions and Complications:     Dimension 4:  Readiness  to Change:     Dimension 5:  Relapse, Continued use, or Continued Problem Potential:     Dimension 6:  Recovery/Living Environment:     ASAM Severity Score:    ASAM Recommended Level of Treatment:     Substance use Disorder (SUD)    Recommendations for Services/Supports/Treatments: Recommendations for Services/Supports/Treatments Recommendations For Services/Supports/Treatments: Medication Management, Inpatient Hospitalization  Discharge Disposition:    DSM5 Diagnoses: Patient Active Problem List   Diagnosis Date Noted   Schizophrenia (HCC) 10/29/2018   Pneumomediastinum (HCC) 10/26/2018   Toxic encephalopathy 10/26/2018   Hypokalemia 10/26/2018   PTSD (post-traumatic stress disorder) 06/20/2012   ADHD (attention deficit hyperactivity disorder), 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

## 2021-02-17 NOTE — ED Notes (Signed)
Pt belongings in labeled and placed in cabinet in triage
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
## 2021-02-17 NOTE — ED Notes (Signed)
Pt arrived in TCU at 1330. Ambulates with no issues. Has been polite and redirectable thus far. Pt had sandwich and drink.

## 2021-02-17 NOTE — ED Notes (Signed)
Pt is seeing people that are not there and punching the air. He is angry that these people will not leave him alone. Also spitting at people that are not there.

## 2021-02-17 NOTE — ED Triage Notes (Signed)
Pt BIB [employer] and is IVC'd. Patient  was found in a parking deck hallucinating, yelling, and punching the air. Patient has not been tending to hygiene and verbally aggressive and out of tough with reality. He is a danger to himself and others.

## 2021-02-17 NOTE — BH Assessment (Signed)
Requested Alvino Chapel, RN and Michel Santee, RN via secure chat to place TTS machine in patient's room for his initial assessment.

## 2021-02-17 NOTE — Consult Note (Signed)
EKG ordered: QT/QTc 426/396  Zyprexa 5 mg PO Q day ordered for psychosis  Continue to monitor for stabilization while awaiting inpatient psychiatric bed.

## 2021-02-18 LAB — RESP PANEL BY RT-PCR (FLU A&B, COVID) ARPGX2
Influenza A by PCR: NEGATIVE
Influenza B by PCR: NEGATIVE
SARS Coronavirus 2 by RT PCR: NEGATIVE

## 2021-02-18 MED ORDER — HYDROXYZINE HCL 10 MG PO TABS
10.0000 mg | ORAL_TABLET | Freq: Once | ORAL | Status: AC
  Administered 2021-02-18: 10 mg via ORAL
  Filled 2021-02-18: qty 1

## 2021-02-18 NOTE — ED Notes (Signed)
Pt DC d off unit to facility per provider. Pt alert, cooperative, no s/s of distress. DC information and belongings given to sheriff for transport. Report given to RN at [facility]. Pt ambulatory off unit, escorted and transported by sheriff.

## 2021-02-18 NOTE — BH Assessment (Addendum)
[facility] Assessment Progress Note   At 12:00 this writer called [facility] to inquire about current availability of bed offered last night.  I spoke to Maralyn Sago.  She reports that, while they do have a policy prohibiting them from receiving patient within 24 hours of any periods of seclusion and/or restraints, that this policy does not apply to post injectable medication.  She adds that the bed offered last night is still available.  She recommends that pt's nurse call report to facilitate transfer.  EDP Gloris Manchester, MD and pt's nurse, Waynetta Sandy, have been notified.  Doylene Canning, [place] [facility] Coordinator [phone]  Addendum:  Per note authored by Gwenevere Ghazi, LCSWA on 02/17/2021 at 22:41, pt was accepted to [facility] by Dr Estill Cotta to their main campus; no specific bed has been assigned.  Please call report to [phone].  Pt is under IVC and will go by [facility].  Doylene Canning, [place] [facility] Coordinator [phone]

## 2021-02-18 NOTE — ED Notes (Signed)
Lunch tray given. 

## 2021-02-18 NOTE — ED Notes (Addendum)
Pt woke up went into bathroom, yelling and screaming stating "they wont leave him alone, the kept fucking with me in my sleep". Spitting on the floor. Told nurse and I to get out of the room.

## 2021-02-18 NOTE — ED Notes (Signed)
Pt yelling, agitated, yelling at staff.

## 2021-02-18 NOTE — Consult Note (Signed)
Patient is seen and assessed by this nurse practitioner.  Patient remains psychotic, delusional, and confused.  Patient is able to participate and engage in his daily activities as he is observed to be eating breakfast independently.  All attempts to engage patient with conversation are met with conflict, agitation, and self-harm behaviors as patient is observed striking himself in the head.  Patient's urine drug screen is positive for THC.  Patient continues to meet inpatient criteria at this time.   Patient has been accepted to [facility] for inpatient psychiatric admission.  However patient recently received Geodon injection, and will now have to remain in the hospital for additional observation prior to being transferred to this facility.  Social work to continue to follow.

## 2021-02-18 NOTE — ED Provider Notes (Signed)
Emergency Medicine Observation Re-evaluation Note  Dalton Rosario is a 25 y.o. male, seen on rounds today.  Pt initially presented to the ED for complaints of IVC and Schizophrenia Patient presented for hallucinations and aggression.  He arrived via police with IVC paperwork.  Meets criteria for psychiatric inpatient admission.  He has been accepted to [facility].  Transfer scheduled for 8 AM.  Currently, the patient is under IVC.  Physical Exam  BP (!) 141/88 (BP Location: Right Arm)   Pulse 70   Temp 98.6 F (37 C) (Oral)   Resp 18   SpO2 100%  Physical Exam General: Awake and alert Cardiac: Normal rate, extremities well-perfused Lungs: Breathing is even unlabored Psych: Agitated but redirectable  ED Course / MDM  EKG:EKG Interpretation  Date/Time:  Thursday February 17 2021 15:46:38 EDT Ventricular Rate:  52 PR Interval:  198 QRS Duration: 120 QT Interval:  426 QTC Calculation: 396 R Axis:   -67 Text Interpretation: Sinus bradycardia Right bundle branch block Left anterior fascicular block Possible Lateral infarct , age undetermined Confirmed by Gwyneth Sprout ([id number]) on 02/17/2021 4:52:11 PM  I have reviewed the labs performed to date as well as medications administered while in observation.  Recent changes in the last 24 hours include mild agitation this morning when staff interacts with him.  Appears to be calm when left alone.  Patient was accepted at [facility] for inpatient psychiatric care.  On reassessment, patient much more calm.  He was able to engage in a normal conversation.  He understood that he would be taken to [facility] for further management.  Patient was agreeable to this.  Plan  Current plan is for transfer to [name] is under involuntary commitment.     Gloris Manchester, MD 02/18/21 [phone]

## 2021-04-04 ENCOUNTER — Ambulatory Visit (INDEPENDENT_AMBULATORY_CARE_PROVIDER_SITE_OTHER): Payer: No Typology Code available for payment source | Admitting: Psychiatry

## 2021-04-04 ENCOUNTER — Other Ambulatory Visit: Payer: Self-pay

## 2021-04-04 ENCOUNTER — Encounter (HOSPITAL_COMMUNITY): Payer: Self-pay | Admitting: Psychiatry

## 2021-04-04 VITALS — BP 120/76 | HR 83 | Ht 66.0 in | Wt 209.0 lb

## 2021-04-04 DIAGNOSIS — F250 Schizoaffective disorder, bipolar type: Secondary | ICD-10-CM

## 2021-04-04 DIAGNOSIS — F411 Generalized anxiety disorder: Secondary | ICD-10-CM

## 2021-04-04 MED ORDER — PALIPERIDONE PALMITATE ER 234 MG/1.5ML IM SUSY
234.0000 mg | PREFILLED_SYRINGE | Freq: Once | INTRAMUSCULAR | Status: AC
  Administered 2021-04-12: 234 mg via INTRAMUSCULAR

## 2021-04-04 MED ORDER — PALIPERIDONE ER 9 MG PO TB24
9.0000 mg | ORAL_TABLET | ORAL | 0 refills | Status: DC
  Filled 2021-04-04: qty 7, 7d supply, fill #0

## 2021-04-04 MED ORDER — HYDROXYZINE HCL 10 MG PO TABS
10.0000 mg | ORAL_TABLET | Freq: Three times a day (TID) | ORAL | 3 refills | Status: AC | PRN
  Filled 2021-04-04: qty 90, 30d supply, fill #0
  Filled 2021-06-01: qty 90, 30d supply, fill #1

## 2021-04-04 MED ORDER — TRAZODONE HCL 50 MG PO TABS
50.0000 mg | ORAL_TABLET | Freq: Every day | ORAL | 3 refills | Status: AC
  Filled 2021-04-04: qty 30, 30d supply, fill #0
  Filled 2021-06-01: qty 30, 30d supply, fill #1

## 2021-04-04 NOTE — Progress Notes (Signed)
Psychiatric Initial Adult Assessment   Patient Identification: Dalton Rosario MRN:  [id number] Date of Evaluation:  04/04/2021 Referral Source: [facility] Chief Complaint:  "I went to [facility] and they gave me Haldol but I have not been on anything since" Chief Complaint   New Patient (Initial Visit)    Visit Diagnosis:    ICD-10-CM   1. Schizoaffective disorder, bipolar type (HCC)  F25.0 paliperidone (INVEGA) 9 MG 24 hr tablet    traZODone (DESYREL) 50 MG tablet    paliperidone (INVEGA SUSTENNA) injection 234 mg    2. Generalized anxiety disorder  F41.1 hydrOXYzine (ATARAX/VISTARIL) 10 MG tablet      History of Present Illness: 25 year old male seen today for initial psychiatric evaluation.  He was referred to outpatient psychiatry by [facility] where he was seen on 02/17/2021 through 02/18/2021.  Where he presented with symptoms of schizophrenia and aggression.  Patient was IVC to transfer to [facility].  Provider requested medical records from [facility] however has not obtained them yet.  Patient notes that he is uncertain if he was prescribed medication.  He notes that he had a Haldol injection but notes that he has not taken anything in over a month.  Today he is fairly groomed, pleasant, cooperative, and somewhat engaged in conversation.  He informed Clinical research associate that while at [facility] he received a few Haldol injections.  He notes that he feels somewhat mentally stable however notes that he continues to have symptoms of psychosis.  He informed Clinical research associate that he has auditory hallucinations noting that his voices threatening him and nags him.  He also informed Clinical research associate that he sees anime characters and a character from the horror movie in [place].  Patient informed Clinical research associate that he continues to be anxious and depressed most days.  Provider conducted a GAD-7 and patient scored a 21.  He notes that he is concerned about his housing.  Patient was seen with his care manager Angie who  notes that patient has been referred to the [facility] which will help him about disability forms and potentially help with housing in the future.  Patient's case manager notes that she is known him for over a year and reports that currently he has been able to verbalize his feelings and need as he is more mentally stable.  Provider also conducted a PHQ-9 and patient scored a 27.  Patient notes his sleep fluctuates.  He notes that his sleep patterns are not the best because he is homeless.  Patient notes he sleeps 3 to 4 hours nightly.  He endorses adequate appetite.  Patient endorses passive SI however notes that he does not want to harm himself.  Patient informed writer that he has generally body pains.  He notes that at times he is short of breath because of his bodily pains.  He describes his pain as nagging and quantifies it as 10/10.  [facility] for primary care as well as their pharmacy.  Today patient is agreeable to starting Invega 9 mg oral tablets.  If successful patient will receive his first Invega 234 mg injection next week and then a maintenance 156 mg a week later.  He is also agreeable to starting hydroxyzine 10 mg 3 times daily to help manage anxiety.  He will start trazodone 50 mg nightly to help manage sleep.Potential side effects of medication and risks vs benefits of treatment vs non-treatment were explained and discussed. All questions  were answered.  No other concerns at this time.   Associated Signs/Symptoms: Depression Symptoms:  depressed mood, anhedonia, insomnia, psychomotor agitation, fatigue, feelings of worthlessness/guilt, difficulty concentrating, hopelessness, impaired memory, recurrent thoughts of death, suicidal thoughts without plan, anxiety, panic attacks, loss of energy/fatigue, disturbed sleep, (Hypo) Manic Symptoms:  Distractibility, Elevated Mood, Flight of Ideas, Immunologist, Hallucinations, Impulsivity, Irritable Mood, Anxiety Symptoms:  Excessive Worry, Psychotic Symptoms:  Hallucinations: Auditory Visual PTSD Symptoms: NA  Past Psychiatric History: PTSD, ADHD, schizophrenia  Previous Psychotropic Medications:  Haldol  Substance Abuse History in the last 12 months:  Yes.    Consequences of Substance Abuse: Marijuana and alcohol  Past Medical History:  Past Medical History:  Diagnosis Date   ADHD (attention deficit hyperactivity disorder)    Anxiety    Asthma    Depression    Schizo-affective schizophrenia, chronic condition (HCC) 2019    Past Surgical History:  Procedure Laterality Date   APPENDECTOMY     Pt was 25 yo    Family Psychiatric History: Unknown  Family History: No family history on file.  Social History:   Social History   Socioeconomic History   Marital status: Single    Spouse name: Not on file   Number of children: Not on file   Years of education: Not on file   Highest education level: Not on file  Occupational History   Not on file  Tobacco Use   Smoking status: Every Day    Packs/day: 0.50    Types: Cigarettes   Smokeless tobacco: Never  Substance and Sexual Activity   Alcohol use: Yes   Drug use: Yes    Types: Cocaine, Marijuana   Sexual activity: Never  Other Topics Concern   Not on file  Social History Narrative   Not on file   Social Determinants of Health   Financial Resource Strain: Not on file  Food Insecurity: Not on file  Transportation Needs: Not on file  Physical Activity: Not on file  Stress: Not on file  Social Connections: Not on file    Additional Social History: Patient resides in [place].  He is homeless and has no children.  He is currently unemployed.  He endorses smoking marijuana and drinking alcohol.  Allergies:  No Known Allergies  Metabolic Disorder Labs: Lab Results  Component Value Date   HGBA1C 5.4 06/21/2012   MPG 108 06/21/2012   No results  found for: PROLACTIN Lab Results  Component Value Date   CHOL 201 (H) 06/21/2012   TRIG 207 (H) 06/21/2012   HDL 42 06/21/2012   CHOLHDL 4.8 06/21/2012   VLDL 41 (H) 06/21/2012   LDLCALC 118 (H) 06/21/2012   Lab Results  Component Value Date   TSH 1.524 06/21/2012    Therapeutic Level Labs: No results found for: LITHIUM No results found for: CBMZ No results found for: VALPROATE  Current Medications: Current Outpatient Medications  Medication Sig Dispense Refill   hydrOXYzine (ATARAX/VISTARIL) 10 MG tablet Take 1 tablet (10 mg total) by mouth 3 (three) times daily as needed. 90 tablet 3   paliperidone (INVEGA) 9 MG 24 hr tablet Take 1 tablet (9 mg total) by mouth every morning. 7 tablet 0   traZODone (DESYREL) 50 MG tablet Take 1 tablet (50 mg total) by mouth at bedtime. 30 tablet 3   Current Facility-Administered Medications  Medication Dose Route Frequency Provider Last Rate Last Admin   paliperidone (INVEGA SUSTENNA) injection 234 mg  234 mg Intramuscular Once Toy Cookey  E, NP        Musculoskeletal: Strength & Muscle Tone: within normal limits Gait & Station: normal Patient leans: N/A  Psychiatric Specialty Exam: Review of Systems  Blood pressure 120/76, pulse 83, height 5\' 6"  (1.676 m), weight 209 lb (94.8 kg).Body mass index is 33.73 kg/m.  General Appearance: Fairly Groomed  Eye Contact:  Good  Speech:  Clear and Coherent and Normal Rate  Volume:  Normal  Mood:  Anxious and Depressed  Affect:  Appropriate and Congruent  Thought Process:  Coherent, Goal Directed, and Linear  Orientation:  Full (Time, Place, and Person)  Thought Content:  WDL  Suicidal Thoughts:  Yes.  without intent/plan  Homicidal Thoughts:  No  Memory:  Immediate;   Good Recent;   Good Remote;   Good  Judgement:  Good  Insight:  Good  Psychomotor Activity:  Normal  Concentration:  Concentration: Good and Attention Span: Good  Recall:  Good  Fund of Knowledge:Good  Language:  Good  Akathisia:  No  Handed:  Right  AIMS (if indicated):  not done  Assets:  Communication Skills Desire for Improvement Leisure Time Social Support  ADL's:  Intact  Cognition: WNL  Sleep:  Poor   Screenings: AIMS    Flowsheet Row Admission (Discharged) from OP [facility] from [facility]  AIMS Total Score 0 0      AUDIT    Flowsheet Row Admission (Discharged[facility]  Alcohol Use Disorder Identification Test Final Score (AUDIT) 0      GAD-7    Flowsheet Row Office Visit from 04/04/2021 in [facility]  Total GAD-7 Score 21      PHQ2-9    Flowsheet Row Office Visit from 04/04/2021 in [facility] ED from 08/08/2020 in [facility]  PHQ-2 Total Score 6 2  PHQ-9 Total Score 27 8      Flowsheet Row Office Visit from 04/04/2021 in [facility] ED from 02/17/2021 in [facility] from 08/17/2020 in [facility]  C-SSRS RISK CATEGORY Error: Q7 should not be populated when Q6 is No No Risk No Risk       Assessment and Plan: Patient endorses symptoms of anxiety, depression, and schizoaffective disorder.  Patient notes that he has not been on medications for over a month.  Provider requested patient's record from [facility].  At this time patient received Invega 9 mg to help manage symptoms of psychosis.  If medication is effective he will receive his first injection in a week.  Patient also agreeable to starting trazodone 50 mg to help manage sleep and hydroxyzine 100 mg 3 times daily to help manage symptoms of anxiety.  1. Schizoaffective disorder, bipolar type (HCC)  Start- paliperidone (INVEGA) 9 MG 24 hr tablet;  Take 1 tablet (9 mg total) by mouth every morning.  Dispense: 7 tablet; Refill: 0 Start-- traZODone (DESYREL) 50 MG tablet; Take 1 tablet (50 mg total) by mouth at bedtime.  Dispense: 30 tablet; Refill: 3 Start-- paliperidone (INVEGA SUSTENNA) injection 234 mg  2. Generalized anxiety disorder  Start-- hydrOXYzine (ATARAX/VISTARIL) 10 MG tablet; Take 1 tablet (10 mg total) by mouth 3 (three) times daily as needed.  Dispense: 90 tablet; Refill: 3   [facility], NP 9/19/20224:36 PM

## 2021-04-05 ENCOUNTER — Other Ambulatory Visit: Payer: Self-pay

## 2021-04-12 ENCOUNTER — Ambulatory Visit (INDEPENDENT_AMBULATORY_CARE_PROVIDER_SITE_OTHER): Payer: No Typology Code available for payment source | Admitting: *Deleted

## 2021-04-12 ENCOUNTER — Other Ambulatory Visit: Payer: Self-pay

## 2021-04-12 DIAGNOSIS — F250 Schizoaffective disorder, bipolar type: Secondary | ICD-10-CM

## 2021-04-12 DIAGNOSIS — F201 Disorganized schizophrenia: Secondary | ICD-10-CM | POA: Diagnosis not present

## 2021-04-12 NOTE — Progress Notes (Signed)
Patient arrived for Injection paliperidone (INVEGA SUSTENNA) 234 mg tolerated injection well in Right Arm

## 2021-04-14 ENCOUNTER — Telehealth (HOSPITAL_COMMUNITY): Payer: Self-pay | Admitting: Psychiatry

## 2021-04-14 NOTE — Telephone Encounter (Signed)
Provider spoke to patient's case manager Ms. Orinda Kenner.  She informed Clinical research associate that recently patient mental health has been declining.  She notes that he has been hitting himself and on 1 occasion was placed in jail.  Provider informed patient to bring Mr. Tabb to [facility] for further evaluation if he continues to decline and becomes a danger to himself.  She endorsed understanding and agreed.  No other concerns noted at this time.

## 2021-04-14 NOTE — Telephone Encounter (Signed)
Dalton Rosario, street outreach case manager, reporting for provider: patient demonstrating worsening symptoms since receiving injection on 12/11/2020. "Seeing things, yelling, punching himself in the face giving him 2 black eyes." Marylene Land also reports [employer] has been called twice today regarding patient behavior.  Dalton Rosario [phone].

## 2021-05-06 ENCOUNTER — Telehealth (HOSPITAL_COMMUNITY): Payer: Self-pay | Admitting: *Deleted

## 2021-05-11 ENCOUNTER — Other Ambulatory Visit (HOSPITAL_COMMUNITY): Payer: Self-pay | Admitting: Psychiatry

## 2021-05-11 NOTE — Telephone Encounter (Signed)
Provider attempted to call patient caseworker Ms. Marylene Land without success.  Provider left message notifying patient's caseworker that she can call the clinic if she had other questions or concerns.  Nursing staff has faxed over patient's medication list for their review.  No other concerns noted at this time.

## 2021-05-12 ENCOUNTER — Ambulatory Visit (HOSPITAL_COMMUNITY): Payer: No Typology Code available for payment source

## 2021-05-23 ENCOUNTER — Ambulatory Visit (INDEPENDENT_AMBULATORY_CARE_PROVIDER_SITE_OTHER): Payer: No Typology Code available for payment source | Admitting: *Deleted

## 2021-05-23 ENCOUNTER — Other Ambulatory Visit: Payer: Self-pay

## 2021-05-23 DIAGNOSIS — F201 Disorganized schizophrenia: Secondary | ICD-10-CM | POA: Diagnosis not present

## 2021-05-23 NOTE — Progress Notes (Signed)
Patient arrived with his Community Case Psychologist, clinical. Patient was released from jail on today  & wanted to get his injection.  Patient was last seen on 04/04/21 & asked provider  Dr Lona Kettle came  in & spoke with patient & agreed for patient to get his injection  paliperidone (INVEGA SUSTENNA) 234. Tolerated well in Left -Arm.

## 2021-06-01 ENCOUNTER — Other Ambulatory Visit: Payer: Self-pay

## 2021-06-06 ENCOUNTER — Other Ambulatory Visit: Payer: Self-pay

## 2021-06-07 ENCOUNTER — Ambulatory Visit (HOSPITAL_COMMUNITY): Payer: No Typology Code available for payment source

## 2021-06-16 ENCOUNTER — Other Ambulatory Visit (HOSPITAL_COMMUNITY): Payer: Self-pay | Admitting: Psychiatry

## 2021-06-16 MED ORDER — INVEGA SUSTENNA 156 MG/ML IM SUSY
156.0000 mg | PREFILLED_SYRINGE | INTRAMUSCULAR | 11 refills | Status: DC
Start: 2021-06-16 — End: 2021-06-21

## 2021-06-16 MED ORDER — PALIPERIDONE PALMITATE ER 156 MG/ML IM SUSY
156.0000 mg | PREFILLED_SYRINGE | Freq: Once | INTRAMUSCULAR | Status: AC
  Administered 2021-06-21: 156 mg via INTRAMUSCULAR

## 2021-06-20 ENCOUNTER — Ambulatory Visit (HOSPITAL_COMMUNITY): Payer: No Typology Code available for payment source

## 2021-06-21 ENCOUNTER — Ambulatory Visit (INDEPENDENT_AMBULATORY_CARE_PROVIDER_SITE_OTHER): Payer: No Typology Code available for payment source | Admitting: *Deleted

## 2021-06-21 ENCOUNTER — Ambulatory Visit (INDEPENDENT_AMBULATORY_CARE_PROVIDER_SITE_OTHER): Payer: No Typology Code available for payment source | Admitting: Family

## 2021-06-21 ENCOUNTER — Other Ambulatory Visit: Payer: Self-pay

## 2021-06-21 ENCOUNTER — Encounter (HOSPITAL_COMMUNITY): Payer: Self-pay | Admitting: Family

## 2021-06-21 ENCOUNTER — Encounter (HOSPITAL_COMMUNITY): Payer: Self-pay

## 2021-06-21 VITALS — BP 115/71 | HR 93 | Ht 66.0 in | Wt 224.0 lb

## 2021-06-21 DIAGNOSIS — F250 Schizoaffective disorder, bipolar type: Secondary | ICD-10-CM | POA: Diagnosis not present

## 2021-06-21 DIAGNOSIS — F201 Disorganized schizophrenia: Secondary | ICD-10-CM

## 2021-06-21 MED ORDER — INVEGA SUSTENNA 234 MG/1.5ML IM SUSY: 234.0000 mg | PREFILLED_SYRINGE | INTRAMUSCULAR | 3 refills | Status: AC

## 2021-06-21 NOTE — Progress Notes (Signed)
[facility] MD/PA/NP OP Progress Note  06/21/2021 12:21 PM Dalton Rosario  MRN:  [id number]  Chief Complaint:  HPI:  Dalton Rosario "Dalton Rosario" Dalton Rosario is a 25 year old male seen today for follow-up psychiatric evaluation. Levada Dy, case Freight forwarder with [facility] [place], remains present during assessment per patient preference.   Psychiatric history includes schizo-affective schizophrenia, ADHD, anxiety and depression.   He has been managed historically on Invega 156mg  IM.   Patient reports history of non-suicidal, self-harm behaviors. Several weeks ago Dalton Rosario punched himself in the face  "because of problems." He reports punching himself when he "feels down."  Discussed potential follow-up with outpatient counseling, patient agrees with plan.  Recent stressors include upcoming court date in January, 2023 related to patient "knocking out a window when angry."  Patient is assessed face-to-face by nurse practitioner, chart reviewed.    Today patient is seated, no acute distress. He appears disheveled, likely typical of current homeless situation.  He is alert and oriented, pleasant and cooperative.. He has clear and coherent speech average volume.  Behavior calm and appropriate, with good eye contact.  Mood today appears euthymic, congruent affect.    Provider completed screening including a PHQ-9, he scored a 9, at last visit he scored a 27.  He denies suicidal and homicidal ideations currently.  He contracts verbally for safety with this Probation officer.    He denies both auditory and visual hallucinations currently.  There is no indication that he is responding to internal stimuli,.  Dalton Rosario does endorse potential fixed delusion. States "the spirits are always there."   Denies hallucinations, denies command hallucinations.  no evidence of delusional thought content.  Patient is able to converse coherently with goal-directed thoughts and no distractibility or preoccupation. He denies paranoia.   Objectively there is no evidence of psychosis/mania. Patient is insightful regarding treatment and diagnosis.   Patient is tolerating medications with no adverse effects/reactions per his report.  Patient reports average sleep, approximately 9-11 hours each night. Case manager shares that sleep can be negatively affected by events occurring downtown (parade, Social research officer, government) as Dalton Rosario sleeps outside downtown.   He denies physical pain currently.   He denies both alcohol and substance use.  Patient provided support and encouragement.  Patient to receive monthly LAI Invega Sustenna 156mg  IM monthly. Plan to follow-up in one month for [place] monthly injection.  Patient offered support and encouragement. Discussed feelings and treatment progress.  He is forward thinking and goal oriented.  He discusses looking forward to upcoming housing options. Patient is tolerating medications with no adverse effects/reactions per his report. Dalton Rosario agrees with plan to increase Kirt Boys from 156mg  iM monthly to 234mg  IM monthly.   Case Manager, Levada Dy, meets with Pena daily. She reports while symptoms appear to have improved she has recently observed Dalton Rosario as he appeared to have a conversation with someone when no one was there.    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 Schizoaffective disorder, bipolar type ([place])  F25.0       Past Psychiatric History: Schizoaffective schizophrenia, ADHD, anxiety, depression  Past Medical History:  Past Medical History:  Diagnosis Date   ADHD (attention deficit hyperactivity disorder)    Anxiety    Asthma    Depression    Schizo-affective schizophrenia, chronic condition ([place]) 2019    Past Surgical History:  Procedure Laterality Date   APPENDECTOMY     Pt was 25 yo  Family Psychiatric History: None reported  Family History: History reviewed. No pertinent family history.  Social History:  Social History    Socioeconomic History   Marital status: Single    Spouse name: Not on file   Number of children: Not on file   Years of education: Not on file   Highest education level: Not on file  Occupational History   Not on file  Tobacco Use   Smoking status: Every Day    Packs/day: 0.50    Types: Cigarettes   Smokeless tobacco: Never  Substance and Sexual Activity   Alcohol use: Yes   Drug use: Yes    Types: Cocaine, Marijuana   Sexual activity: Never  Other Topics Concern   Not on file  Social History Narrative   Not on file   Social Determinants of Health   Financial Resource Strain: Not on file  Food Insecurity: Not on file  Transportation Needs: Not on file  Physical Activity: Not on file  Stress: Not on file  Social Connections: Not on file    Allergies: No Known Allergies  Metabolic Disorder Labs: Lab Results  Component Value Date   HGBA1C 5.4 06/21/2012   MPG 108 06/21/2012   No results found for: PROLACTIN Lab Results  Component Value Date   CHOL 201 (H) 06/21/2012   TRIG 207 (H) 06/21/2012   HDL 42 06/21/2012   CHOLHDL 4.8 06/21/2012   VLDL 41 (H) 06/21/2012   LDLCALC 118 (H) 06/21/2012   Lab Results  Component Value Date   TSH 1.524 06/21/2012    Therapeutic Level Labs: No results found for: LITHIUM No results found for: VALPROATE No components found for:  CBMZ  Current Medications: Current Outpatient Medications  Medication Sig Dispense Refill   hydrOXYzine (ATARAX/VISTARIL) 10 MG tablet Take 1 tablet (10 mg total) by mouth 3 (three) times daily as needed. 90 tablet 3   paliperidone (INVEGA SUSTENNA) 156 MG/ML SUSY injection Inject 1 mL (156 mg total) into the muscle every 28 (twenty-eight) days. 1 mL 11   traZODone (DESYREL) 50 MG tablet Take 1 tablet (50 mg total) by mouth at bedtime. 30 tablet 3   No current facility-administered medications for this visit.     Musculoskeletal: Strength & Muscle Tone: within normal limits Gait &  Station: normal Patient leans: N/A  Psychiatric Specialty Exam: Review of Systems  Constitutional: Negative.   HENT: Negative.    Eyes: Negative.   Cardiovascular: Negative.   Gastrointestinal: Negative.   Endocrine: Negative.   Genitourinary: Negative.   Musculoskeletal: Negative.   Skin: Negative.   Neurological: Negative.   Hematological: Negative.   Psychiatric/Behavioral:  Positive for self-injury.   All other systems reviewed and are negative.  There were no vitals taken for this visit.There is no height or weight on file to calculate BMI.  General Appearance: Disheveled  Eye Contact:  Fair  Speech:  Clear and Coherent and Normal Rate  Volume:  Normal  Mood:  Euthymic  Affect:  Appropriate and Congruent  Thought Process:  Coherent, Goal Directed, and Linear  Orientation:  Full (Time, Place, and Person)  Thought Content: Logical and Tangential   Suicidal Thoughts:  No  Homicidal Thoughts:  No  Memory:  Immediate;   Fair Recent;   Fair Remote;   Fair  Judgement:  Fair  Insight:  Shallow  Psychomotor Activity:  Normal  Concentration:  Concentration: Good and Attention Span: Good  Recall:  Fair  Fund of Knowledge: Good  Language: Good  Akathisia:  No  Handed:  Right  AIMS (if indicated): done  Assets:  Communication Skills Desire for Improvement Leisure Time Physical Health Resilience Social Support  ADL's:  Intact  Cognition: WNL  Sleep:  Good   Screenings: AIMS    Flowsheet Row Admission (Discharged) from OP Visit from 11/15/2018 in [place] Admission (Discharged) from 10/29/2018 in [facility] [facility]  AIMS Total Score 0 0      AUDIT    Flowsheet Row Admission (Discharged) from 10/29/2018 in [facility]  Alcohol Use Disorder Identification Test Final Score (AUDIT) 0      GAD-7    Flowsheet Row Office Visit from 04/04/2021 in [facility]  Total GAD-7 Score 21      PHQ2-9    [place] Office Visit from 06/21/2021 in [facility] Office Visit from 04/04/2021 in [facility] ED from 08/08/2020 in [place] DEPT  PHQ-2 Total Score 3 6 2   PHQ-9 Total Score 7 27 8       [place] Office Visit from 06/21/2021 in [facility] Office Visit from 04/04/2021 in [facility] ED from 02/17/2021 in [place] DEPT  C-SSRS RISK CATEGORY No Risk Error: Q7 should not be populated when Q6 is No No Risk        Assessment and Plan: Patient reviewed with Dr. Hampton Abbot. Symptoms including likely hallucinations and delusional thought content, improving but remain, will increase [place] IM dose to 234mg  monthly.    Lucky Rathke, FNP 06/21/2021, 12:21 PM

## 2021-06-21 NOTE — Progress Notes (Addendum)
Patient has arrived for injection paliperidone (INVEGA SUSTENNA) injection 234 mg  Patient's community case mgr(Angela) is also present & stated that patient has given himself 2 black-eyes.Apparently when he gets overwhelmed he beats/hit's himself.   Injection tolerated well in Right-Arm.

## 2021-07-06 ENCOUNTER — Encounter (HOSPITAL_COMMUNITY): Payer: No Typology Code available for payment source | Admitting: Psychiatry

## 2021-07-19 ENCOUNTER — Ambulatory Visit (HOSPITAL_COMMUNITY): Payer: No Typology Code available for payment source

## 2021-07-27 ENCOUNTER — Telehealth (HOSPITAL_COMMUNITY): Payer: Self-pay | Admitting: *Deleted

## 2021-07-27 NOTE — Telephone Encounter (Signed)
Call to follow up with him re his missed appt for his shot on 07/19/21. I spoke with his case manager and she said he would not go with her for his shot and was refusing services. She said she had called his ACT team and they were going out to try to touch base with him. Him having an ACT team is not something I was aware of or we here would not have been providing him services.

## 2022-03-18 ENCOUNTER — Other Ambulatory Visit: Payer: Self-pay

## 2022-03-18 ENCOUNTER — Encounter (HOSPITAL_COMMUNITY): Payer: Self-pay

## 2022-03-18 ENCOUNTER — Emergency Department (HOSPITAL_COMMUNITY)
Admission: EM | Admit: 2022-03-18 | Discharge: 2022-03-18 | Disposition: A | Discharge status: Home / Self Care | Payer: Medicaid Other | Attending: Emergency Medicine | Admitting: Emergency Medicine

## 2022-03-18 DIAGNOSIS — Z5900 Homelessness unspecified: Secondary | ICD-10-CM | POA: Diagnosis not present

## 2022-03-18 DIAGNOSIS — R21 Rash and other nonspecific skin eruption: Secondary | ICD-10-CM | POA: Insufficient documentation

## 2022-03-18 MED ORDER — DIPHENHYDRAMINE HCL 50 MG/ML IJ SOLN: 50.0000 mg | Freq: Once | INTRAMUSCULAR | Status: DC

## 2022-03-18 MED ORDER — DIPHENHYDRAMINE HCL 25 MG PO CAPS
50.0000 mg | ORAL_CAPSULE | Freq: Once | ORAL | Status: AC
  Administered 2022-03-18: 50 mg via ORAL
  Filled 2022-03-18: qty 2

## 2022-03-18 MED ORDER — DEXAMETHASONE SODIUM PHOSPHATE 10 MG/ML IJ SOLN: 10.0000 mg | Freq: Once | INTRAMUSCULAR | Status: DC

## 2022-03-18 MED ORDER — DOXYCYCLINE HYCLATE 100 MG PO CAPS: 100.0000 mg | ORAL_CAPSULE | Freq: Two times a day (BID) | ORAL | 0 refills | Status: AC

## 2022-03-18 MED ORDER — PREDNISONE 10 MG (21) PO TBPK: ORAL_TABLET | Freq: Every day | ORAL | 0 refills | Status: AC

## 2022-03-18 MED ORDER — PREDNISONE 20 MG PO TABS
60.0000 mg | ORAL_TABLET | Freq: Once | ORAL | Status: AC
  Administered 2022-03-18: 60 mg via ORAL
  Filled 2022-03-18: qty 3

## 2022-03-18 NOTE — ED Provider Notes (Signed)
[facility]-EMERGENCY DEPT Provider Note   CSN: [id number] Arrival date & time: 03/18/22  2125     History  Chief Complaint  Patient presents with   Rash    Dalton Rosario is a 26 y.o. male.  26 year old male presents with possible spider bites.  States that he is homeless at this time.  Noticed diffuse itching.  Is unsure of when he has changed his close.  Denies any fever with this.  No oral involvement of his rash.  Notes compliance with his gastrophrenic medications.  No treatment used for this prior to arrival       Home Medications Prior to Admission medications   Medication Sig Start Date End Date Taking? Authorizing Provider  hydrOXYzine (ATARAX/VISTARIL) 10 MG tablet Take 1 tablet (10 mg total) by mouth 3 (three) times daily as needed. 04/04/21   Shanna Cisco, NP  paliperidone (INVEGA SUSTENNA) 234 MG/1.5ML SUSY injection Inject 234 mg into the muscle every 30 (thirty) days for 3 doses. 07/22/21 09/21/21  Lenard Lance, FNP  traZODone (DESYREL) 50 MG tablet Take 1 tablet (50 mg total) by mouth at bedtime. 04/04/21   Shanna Cisco, NP      Allergies    Patient has no known allergies.    Review of Systems   Review of Systems  All other systems reviewed and are negative.   Physical Exam Updated Vital Signs BP 132/82 (BP Location: Left Arm)   Pulse 100   Temp 98.2 F (36.8 C) (Oral)   Resp 16   Ht 1.676 m (5\' 6" )   Wt 95.3 kg   SpO2 100%   BMI 33.89 kg/m  Physical Exam Vitals and nursing note reviewed.  Constitutional:      General: He is not in acute distress.    Appearance: Normal appearance. He is well-developed. He is not toxic-appearing.  HENT:     Head: Normocephalic and atraumatic.  Eyes:     General: Lids are normal.     Conjunctiva/sclera: Conjunctivae normal.     Pupils: Pupils are equal, round, and reactive to light.  Neck:     Thyroid: No thyroid mass.     Trachea: No tracheal deviation.  Cardiovascular:      Rate and Rhythm: Normal rate and regular rhythm.     Heart sounds: Normal heart sounds. No murmur heard.    No gallop.  Pulmonary:     Effort: Pulmonary effort is normal. No respiratory distress.     Breath sounds: Normal breath sounds. No stridor. No decreased breath sounds, wheezing, rhonchi or rales.  Abdominal:     General: There is no distension.     Palpations: Abdomen is soft.     Tenderness: There is no abdominal tenderness. There is no rebound.  Musculoskeletal:        General: No tenderness. Normal range of motion.     Cervical back: Normal range of motion and neck supple.  Skin:    General: Skin is warm and dry.     Findings: Rash present. No abrasion. Rash is macular. Rash is not purpuric, pustular or urticarial.  Neurological:     Mental Status: He is alert and oriented to person, place, and time. Mental status is at baseline.     GCS: GCS eye subscore is 4. GCS verbal subscore is 5. GCS motor subscore is 6.     Cranial Nerves: No cranial nerve deficit.     Sensory: No sensory deficit.  Motor: Motor function is intact.  Psychiatric:        Attention and Perception: Attention normal.        Speech: Speech normal.        Behavior: Behavior normal.     ED Results / Procedures / Treatments   Labs (all labs ordered are listed, but only abnormal results are displayed) Labs Reviewed - No data to display  EKG None  Radiology No results found.  Procedures Procedures    Medications Ordered in ED Medications - No data to display  ED Course/ Medical Decision Making/ A&P                           Medical Decision Making  Patient with diffuse macular rash .  He has no oral involvement.  He is afebrile at this time.  States that his rash is gone on for several days although his timeline is unclear.  We will treat for suspected contact dermatitis.  We will also place on antibiotics for suspected secondary soft tissue infection.  No concern for serious necrotizing  tissue infections.  Offered patient homeless resources which she has deferred.  Return precautions given        Final Clinical Impression(s) / ED Diagnoses Final diagnoses:  None    Rx / DC Orders ED Discharge Orders     None         Lorre Nick, MD 03/18/22 2243

## 2022-03-18 NOTE — ED Triage Notes (Signed)
Patient BIB EMS from home c/o rash. Per report pt stated he see's spiders crawling under his pants and hands. Rash noted on his inner thigh and arms. Pt hx Schizophrenia and hallucinations.

## 2022-08-06 ENCOUNTER — Other Ambulatory Visit: Payer: Self-pay

## 2022-08-06 ENCOUNTER — Encounter (HOSPITAL_COMMUNITY): Payer: Self-pay | Admitting: Emergency Medicine

## 2022-08-06 ENCOUNTER — Emergency Department (HOSPITAL_COMMUNITY)
Admission: EM | Admit: 2022-08-06 | Discharge: 2022-08-07 | Disposition: A | Discharge status: Home / Self Care | Payer: Medicaid Other | Attending: Emergency Medicine | Admitting: Emergency Medicine

## 2022-08-06 ENCOUNTER — Emergency Department (HOSPITAL_COMMUNITY): Payer: Medicaid Other

## 2022-08-06 DIAGNOSIS — J45909 Unspecified asthma, uncomplicated: Secondary | ICD-10-CM | POA: Diagnosis not present

## 2022-08-06 DIAGNOSIS — M7989 Other specified soft tissue disorders: Secondary | ICD-10-CM | POA: Insufficient documentation

## 2022-08-06 DIAGNOSIS — Z7952 Long term (current) use of systemic steroids: Secondary | ICD-10-CM | POA: Diagnosis not present

## 2022-08-06 NOTE — ED Triage Notes (Signed)
[place] with reports of R hand pain. Pt states he hit a window 3 months ago. Swelling noted.  136/76 HR 84 99% RA CBG 119

## 2022-08-06 NOTE — ED Provider Triage Note (Signed)
Emergency Medicine Provider Triage Evaluation Note  Dulce Sellar , a 27 y.o. male  was evaluated in triage.  Pt complains of right hand pain. States he punch a window about one month ago.  Review of Systems  Positive: Hand pain, swelling, tenderness Negative: Other injury  Physical Exam  BP 137/87   Pulse 80   Temp 97.8 F (36.6 C)   Resp 20   Wt 95.3 kg   SpO2 97%   BMI 33.89 kg/m  Gen:   Awake, no distress   Resp:  Normal effort  MSK:   Moves extremities without difficulty  Other:  Swelling noted over dorsal aspect of right third and fifth MCP  Medical Decision Making  Medically screening exam initiated at 8:28 PM.  Appropriate orders placed.  Drayke PRUITT TABOADA was informed that the remainder of the evaluation will be completed by another provider, this initial triage assessment does not replace that evaluation, and the importance of remaining in the ED until their evaluation is complete.     Etta Quill, NP 08/06/22 2030

## 2022-08-07 MED ORDER — IBUPROFEN 800 MG PO TABS
800.0000 mg | ORAL_TABLET | Freq: Once | ORAL | Status: AC
  Administered 2022-08-07: 800 mg via ORAL
  Filled 2022-08-07: qty 1

## 2022-08-07 MED ORDER — ALBUTEROL SULFATE HFA 108 (90 BASE) MCG/ACT IN AERS
2.0000 | INHALATION_SPRAY | Freq: Once | RESPIRATORY_TRACT | Status: AC
  Administered 2022-08-07: 2 via RESPIRATORY_TRACT
  Filled 2022-08-07: qty 6.7

## 2022-08-07 NOTE — Discharge Instructions (Signed)
Continue tylenol or motrin as needed for pain. Follow-up with your primary care doctor. Return here for new concerns.

## 2022-08-07 NOTE — ED Provider Notes (Signed)
[facility] Provider Note   CSN: [id number] Arrival date & time: 08/06/22  1925     History  Chief Complaint  Patient presents with   Hand Injury    Dalton Rosario is a 27 y.o. male.  The history is provided by the patient and medical records.  Hand Injury  27 year old male presenting to the ED with right hand swelling.  Patient reports several months ago he punched a window and since then has had ongoing swelling to the right hand, particularly along the third MCP joint.  He denies any redness, fever, numbness, or tingling of the fingers.  He is right-hand dominant.  He has not tried any medications for his symptoms.  Also requesting inhaler for his asthma.  States he has not had any of his asthma medications in several months.  He does tend to have issues during the cold weather.  He denies any shortness of breath but does have intermittent wheezing.  No cough or fever.  No sick contacts.  Home Medications Prior to Admission medications   Medication Sig Start Date End Date Taking? Authorizing Provider  doxycycline (VIBRAMYCIN) 100 MG capsule Take 1 capsule (100 mg total) by mouth 2 (two) times daily. 03/18/22   Lacretia Leigh, MD  hydrOXYzine (ATARAX/VISTARIL) 10 MG tablet Take 1 tablet (10 mg total) by mouth 3 (three) times daily as needed. 04/04/21   Salley Slaughter, NP  paliperidone (INVEGA SUSTENNA) 234 MG/1.5ML SUSY injection Inject 234 mg into the muscle every 30 (thirty) days for 3 doses. 07/22/21 09/21/21  Lucky Rathke, FNP  predniSONE (STERAPRED UNI-PAK 21 TAB) 10 MG (21) TBPK tablet Take by mouth daily. Take 6 tabs by mouth daily  for 2 days, then 5 tabs for 2 days, then 4 tabs for 2 days, then 3 tabs for 2 days, 2 tabs for 2 days, then 1 tab by mouth daily for 2 days 03/18/22   Lacretia Leigh, MD  traZODone (DESYREL) 50 MG tablet Take 1 tablet (50 mg total) by mouth at bedtime. 04/04/21   Salley Slaughter, NP      Allergies     Cinnamon    Review of Systems   Review of Systems  Musculoskeletal:  Positive for arthralgias.  All other systems reviewed and are negative.   Physical Exam Updated Vital Signs BP 137/87   Pulse 80   Temp 97.8 F (36.6 C)   Resp 20   Wt 95.3 kg   SpO2 97%   BMI 33.89 kg/m   Physical Exam Vitals and nursing note reviewed.  Constitutional:      Appearance: He is well-developed.  HENT:     Head: Normocephalic and atraumatic.  Eyes:     Conjunctiva/sclera: Conjunctivae normal.     Pupils: Pupils are equal, round, and reactive to light.  Cardiovascular:     Rate and Rhythm: Normal rate and regular rhythm.     Heart sounds: Normal heart sounds.  Pulmonary:     Effort: Pulmonary effort is normal.     Breath sounds: Normal breath sounds. No wheezing or rhonchi.     Comments: Lungs clear, no audible wheezing, speaking in full sentences without difficulty Abdominal:     General: Bowel sounds are normal.     Palpations: Abdomen is soft.  Musculoskeletal:        General: Normal range of motion.     Cervical back: Normal range of motion.     Comments: Right  hand with some mild swelling over third MCP, there is no erythema or open wound, no tissue crepitus, fully able to flex and extend all fingers, radial pulse intact, normal cap refill and distal sensation  Skin:    General: Skin is warm and dry.  Neurological:     Mental Status: He is alert and oriented to person, place, and time.     ED Results / Procedures / Treatments   Labs (all labs ordered are listed, but only abnormal results are displayed) Labs Reviewed - No data to display  EKG None  Radiology DG Hand Complete Right  Result Date: 08/06/2022 CLINICAL DATA:  Hand pain, swelling, remote injury EXAM: RIGHT HAND - COMPLETE 3+ VIEW COMPARISON:  None Available. FINDINGS: Osseous mineralization normal. Joint spaces preserved. No acute fracture, dislocation, or bone destruction. Dorsal soft tissue swelling RIGHT  hand. IMPRESSION: Soft tissue swelling without osseous abnormalities. Electronically Signed   By: Lavonia Dana M.D.   On: 08/06/2022 21:04    Procedures Procedures    Medications Ordered in ED Medications  ibuprofen (ADVIL) tablet 800 mg (800 mg Oral Given 08/07/22 0217)  albuterol (VENTOLIN HFA) 108 (90 Base) MCG/ACT inhaler 2 puff (2 puffs Inhalation Given 08/07/22 0217)    ED Course/ Medical Decision Making/ A&P                             Medical Decision Making Risk Prescription drug management.   27 year old male here with right hand swelling.  He punched a window several months ago, never sought evaluation.  Has had some persistent swelling since.  On exam he does have swelling along the right third MCP joint but there is no open wound or laceration.  No acute signs of infectious process.  His hand is neurovascular intact.  X-ray without any bony findings.  Can continue anti-inflammatories as needed for pain/swelling.  Patient also requesting albuterol inhaler for his asthma.  He reports some issues over the past several days, denies any shortness of breath currently.  His lungs are clear without any wheezes or rhonchi.  Vitals are stable on room air.  He was given albuterol inhaler here, can continue as needed for home use.  Encouraged follow-up with PCP.  Return here for new concerns.  Final Clinical Impression(s) / ED Diagnoses Final diagnoses:  Swelling of right hand    Rx / DC Orders ED Discharge Orders     None         Larene Pickett, PA-C 08/07/22 0305    Orpah Greek, MD 08/07/22 [phone]

## 2022-12-25 ENCOUNTER — Emergency Department (HOSPITAL_COMMUNITY)
Admission: EM | Admit: 2022-12-25 | Discharge: 2022-12-27 | Disposition: A | Discharge status: Home / Self Care | Payer: Medicaid Other | Attending: Emergency Medicine | Admitting: Emergency Medicine

## 2022-12-25 ENCOUNTER — Emergency Department (HOSPITAL_COMMUNITY): Payer: Medicaid Other

## 2022-12-25 ENCOUNTER — Other Ambulatory Visit: Payer: Self-pay

## 2022-12-25 ENCOUNTER — Encounter (HOSPITAL_COMMUNITY): Payer: Self-pay | Admitting: Emergency Medicine

## 2022-12-25 DIAGNOSIS — R451 Restlessness and agitation: Secondary | ICD-10-CM | POA: Insufficient documentation

## 2022-12-25 DIAGNOSIS — S81811A Laceration without foreign body, right lower leg, initial encounter: Secondary | ICD-10-CM | POA: Diagnosis not present

## 2022-12-25 DIAGNOSIS — F209 Schizophrenia, unspecified: Secondary | ICD-10-CM | POA: Diagnosis not present

## 2022-12-25 DIAGNOSIS — S4991XA Unspecified injury of right shoulder and upper arm, initial encounter: Secondary | ICD-10-CM | POA: Diagnosis present

## 2022-12-25 DIAGNOSIS — Z23 Encounter for immunization: Secondary | ICD-10-CM | POA: Diagnosis not present

## 2022-12-25 DIAGNOSIS — Y9302 Activity, running: Secondary | ICD-10-CM | POA: Diagnosis not present

## 2022-12-25 DIAGNOSIS — Z1152 Encounter for screening for COVID-19: Secondary | ICD-10-CM | POA: Insufficient documentation

## 2022-12-25 DIAGNOSIS — W25XXXA Contact with sharp glass, initial encounter: Secondary | ICD-10-CM | POA: Insufficient documentation

## 2022-12-25 DIAGNOSIS — F1994 Other psychoactive substance use, unspecified with psychoactive substance-induced mood disorder: Secondary | ICD-10-CM

## 2022-12-25 DIAGNOSIS — S41111A Laceration without foreign body of right upper arm, initial encounter: Secondary | ICD-10-CM | POA: Diagnosis not present

## 2022-12-25 DIAGNOSIS — R440 Auditory hallucinations: Secondary | ICD-10-CM

## 2022-12-25 DIAGNOSIS — F1910 Other psychoactive substance abuse, uncomplicated: Secondary | ICD-10-CM

## 2022-12-25 LAB — COMPREHENSIVE METABOLIC PANEL
ALT: 16 U/L (ref 0–44)
AST: 17 U/L (ref 15–41)
Albumin: 3.8 g/dL (ref 3.5–5.0)
Alkaline Phosphatase: 58 U/L (ref 38–126)
Anion gap: 11 (ref 5–15)
BUN: 8 mg/dL (ref 6–20)
CO2: 25 mmol/L (ref 22–32)
Calcium: 9.3 mg/dL (ref 8.9–10.3)
Chloride: 102 mmol/L (ref 98–111)
Creatinine, Ser: 0.92 mg/dL (ref 0.61–1.24)
GFR, Estimated: >60 mL/min (ref >60)
Glucose, Bld: 95 mg/dL (ref 70–99)
Potassium: 4.1 mmol/L (ref 3.5–5.1)
Sodium: 138 mmol/L (ref 135–145)
Total Bilirubin: 0.4 mg/dL (ref 0.3–1.2)
Total Protein: 6.9 g/dL (ref 6.5–8.1)

## 2022-12-25 LAB — CBC WITH DIFFERENTIAL/PLATELET
Abs Immature Granulocytes: 0.03 10*3/uL (ref 0.00–0.07)
Basophils Absolute: 0 10*3/uL (ref 0.0–0.1)
Basophils Relative: 0 %
Eosinophils Absolute: 0.1 10*3/uL (ref 0.0–0.5)
Eosinophils Relative: 1 %
HCT: 42.4 % (ref 39.0–52.0)
Hemoglobin: 14.2 g/dL (ref 13.0–17.0)
Immature Granulocytes: 0 %
Lymphocytes Relative: 22 %
Lymphs Abs: 2.2 10*3/uL (ref 0.7–4.0)
MCH: 30.3 pg (ref 26.0–34.0)
MCHC: 33.5 g/dL (ref 30.0–36.0)
MCV: 90.6 fL (ref 80.0–100.0)
Monocytes Absolute: 0.8 10*3/uL (ref 0.1–1.0)
Monocytes Relative: 8 %
Neutro Abs: 6.8 10*3/uL (ref 1.7–7.7)
Neutrophils Relative %: 69 %
Platelets: 205 10*3/uL (ref 150–400)
RBC: 4.68 MIL/uL (ref 4.22–5.81)
RDW: 12.7 % (ref 11.5–15.5)
WBC: 9.9 10*3/uL (ref 4.0–10.5)
nRBC: 0 % (ref 0.0–0.2)

## 2022-12-25 LAB — ETHANOL: Alcohol, Ethyl (B): <10 mg/dL (ref <10)

## 2022-12-25 LAB — RAPID URINE DRUG SCREEN, HOSP PERFORMED
Amphetamines: POSITIVE — AB
Barbiturates: NOT DETECTED
Benzodiazepines: NOT DETECTED
Cocaine: POSITIVE — AB
Opiates: NOT DETECTED
Tetrahydrocannabinol: POSITIVE — AB

## 2022-12-25 LAB — SARS CORONAVIRUS 2 BY RT PCR: SARS Coronavirus 2 by RT PCR: NEGATIVE

## 2022-12-25 LAB — TSH: TSH: 2.215 u[IU]/mL (ref 0.350–4.500)

## 2022-12-25 MED ORDER — LIDOCAINE-EPINEPHRINE (PF) 2 %-1:200000 IJ SOLN
20.0000 mL | Freq: Once | INTRAMUSCULAR | Status: AC
  Administered 2022-12-25: 20 mL
  Filled 2022-12-25: qty 20

## 2022-12-25 MED ORDER — TETANUS-DIPHTH-ACELL PERTUSSIS 5-2.5-18.5 LF-MCG/0.5 IM SUSY
0.5000 mL | PREFILLED_SYRINGE | Freq: Once | INTRAMUSCULAR | Status: AC
  Administered 2022-12-25: 0.5 mL via INTRAMUSCULAR
  Filled 2022-12-25: qty 0.5

## 2022-12-25 MED ORDER — ACETAMINOPHEN 325 MG PO TABS
650.0000 mg | ORAL_TABLET | Freq: Once | ORAL | Status: AC
  Administered 2022-12-25: 650 mg via ORAL
  Filled 2022-12-25: qty 2

## 2022-12-25 MED ORDER — ZIPRASIDONE MESYLATE 20 MG IM SOLR: 20.0000 mg | Freq: Two times a day (BID) | INTRAMUSCULAR | Status: DC | PRN

## 2022-12-25 MED ORDER — RISPERIDONE 1 MG PO TABS
2.0000 mg | ORAL_TABLET | Freq: Every day | ORAL | Status: DC
  Administered 2022-12-25: 2 mg via ORAL
  Filled 2022-12-25: qty 2
  Filled 2022-12-25: qty 1
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
## 2022-12-25 NOTE — Progress Notes (Signed)
At 1719 CSW received a phone call from Deon with [facility] advising that pt could be accepted. Pt was accepted to [facility] TOMORROW 12/26/2022; Bed Assignment [facility] A. However this CSW was then informed to [facility] admission acceptance and await follow up after Deon speaks with [facility] nurse.   At 2126 this CSW called back to [facility] for follow up and was informed that Jearld Lesch was currently accessing a Walk-in. This CSW will assist and follow with placement.  Kelton Pillar, LCSWA 12/25/2022 @ 5:19 PM

## 2022-12-25 NOTE — Consult Note (Cosign Needed Addendum)
[facility] ED ASSESSMENT   Reason for Consult:  Decompensated schizophrenia Referring Physician:  Ernie Avena Patient Identification: Dalton Rosario MRN:  [id number] ED Chief Complaint: <principal problem not specified>  Diagnosis:  Active Problems:   Schizophrenia [facility])   ED Assessment Time Calculation: No data recorded  Subjective:   Dalton Rosario is a 27 y.o. male patient with a medical history significant for schizophrenia who presents to the emergency department with multiple lacerations. Patient is assessed in the hallway after receiving stitches for his lacerations.  Patient stated he banged his head into the glass to kill the voices.  He said "I wish I hit them harder because I want them to die."  Patient firmly denies suicidal ideation and homicidal ideation.  He also denies visual hallucinations.  He endorses auditory hallucinations that "make me crazy; they annoy me."  Patient states that he takes medication every two weeks and that he gets it from his ACT team.  He is unable to name which ACT team company or his ACT team contact; he only says "[place]". Patient states he lives "on the streets" and says he "has no one."    Patient cuts the interview short and refuses to participate further.  He says he just wants the voices to go away.  He says they "even drive me crazy when I sleep." Patient rolls over, faces the wall.  Unsure at this time which medications he is taking.  Attempted to reach [facility] [phone] due to suspicion that this is his ACT provider.  Also consulted with ED pharmacy and they are unable to verify current medication or last LAI dose.     HPI:  [name] is a 27 y.o. male patient with a medical history significant for schizophrenia who presents to the emergency department with multiple lacerations.    Past Psychiatric History: Previous inpatient stay, ACT team services and [facility]  Risk to Self or Others: Is the  patient at risk to self? Yes Has the patient been a risk to self in the past 6 months? No Has the patient been a risk to self within the distant past? No Is the patient a risk to others? No Has the patient been a risk to others in the past 6 months? No Has the patient been a risk to others within the distant past? No  [place] Scale:  Flowsheet Row ED from 12/25/2022 in [facility] Emergency Department at [facility] ED from 08/06/2022 in [facility] Emergency Department at [facility] ED from 03/18/2022 in [facility] Emergency Department at [facility]  C-SSRS RISK CATEGORY No Risk No Risk No Risk      Past Medical History:  Past Medical History:  Diagnosis Date   ADHD (attention deficit hyperactivity disorder)    Anxiety    Asthma    Depression    Schizo-affective schizophrenia, chronic condition (HCC) 2019    Past Surgical History:  Procedure Laterality Date   APPENDECTOMY     Pt was 27 yo   Family History: History reviewed. No pertinent family history.  Social History:  Social History   Substance and Sexual Activity  Alcohol Use Yes     Social History   Substance and Sexual Activity  Drug Use Yes   Types: Cocaine, Marijuana    Social History   Socioeconomic History   Marital status: Single    Spouse name: Not on file   Number of children:  Not on file   Years of education: Not on file   Highest education level: Not on file  Occupational History   Not on file  Tobacco Use   Smoking status: Every Day    Packs/day: .5    Types: Cigarettes   Smokeless tobacco: Never  Substance and Sexual Activity   Alcohol use: Yes   Drug use: Yes    Types: Cocaine, Marijuana   Sexual activity: Never  Other Topics Concern   Not on file  Social History Narrative   Not on file   Social Determinants of Health   Financial Resource Strain: Not on file  Food Insecurity: Not on file  Transportation Needs: Not on file  Physical Activity: Not on file   Stress: Not on file  Social Connections: Not on file   Additional Social History:    Allergies:   Allergies  Allergen Reactions   Cinnamon Nausea Only    Labs:  Results for orders placed or performed during the hospital encounter of 12/25/22 (from the past 48 hour(s))  Comprehensive metabolic panel     Status: None   Collection Time: 12/25/22 12:39 PM  Result Value Ref Range   Sodium 138 135 - 145 mmol/L   Potassium 4.1 3.5 - 5.1 mmol/L   Chloride 102 98 - 111 mmol/L   CO2 25 22 - 32 mmol/L   Glucose, Bld 95 70 - 99 mg/dL    Comment: Glucose reference range applies only to samples taken after fasting for at least 8 hours.   BUN 8 6 - 20 mg/dL   Creatinine, Ser 1.61 0.61 - 1.24 mg/dL   Calcium 9.3 8.9 - 09.6 mg/dL   Total Protein 6.9 6.5 - 8.1 g/dL   Albumin 3.8 3.5 - 5.0 g/dL   AST 17 15 - 41 U/L   ALT 16 0 - 44 U/L   Alkaline Phosphatase 58 38 - 126 U/L   Total Bilirubin 0.4 0.3 - 1.2 mg/dL   GFR, Estimated >04 >54 mL/min    Comment: (NOTE) Calculated using the CKD-EPI Creatinine Equation (2021)    Anion gap 11 5 - 15    Comment: Performed at [facility], [address], [place], [place] [address]  Ethanol     Status: None   Collection Time: 12/25/22 12:39 PM  Result Value Ref Range   Alcohol, Ethyl (B) <10 <10 mg/dL    Comment: (NOTE) Lowest detectable limit for serum alcohol is 10 mg/dL.  For medical purposes only. Performed at [facility], [address] [place], [place] [address]   CBC with Diff     Status: None   Collection Time: 12/25/22 12:39 PM  Result Value Ref Range   WBC 9.9 4.0 - 10.5 K/uL   RBC 4.68 4.22 - 5.81 MIL/uL   Hemoglobin 14.2 13.0 - 17.0 g/dL   HCT 29.5 62.1 - 30.8 %   MCV 90.6 80.0 - 100.0 fL   MCH 30.3 26.0 - 34.0 pg   MCHC 33.5 30.0 - 36.0 g/dL   RDW 65.7 84.6 - 96.2 %   Platelets 205 150 - 400 K/uL   nRBC 0.0 0.0 - 0.2 %   Neutrophils Relative % 69 %   Neutro Abs 6.8 1.7 - 7.7 K/uL   Lymphocytes Relative 22 %    Lymphs Abs 2.2 0.7 - 4.0 K/uL   Monocytes Relative 8 %   Monocytes Absolute 0.8 0.1 - 1.0 K/uL   Eosinophils Relative 1 %  Eosinophils Absolute 0.1 0.0 - 0.5 K/uL   Basophils Relative 0 %   Basophils Absolute 0.0 0.0 - 0.1 K/uL   Immature Granulocytes 0 %   Abs Immature Granulocytes 0.03 0.00 - 0.07 K/uL    Comment: Performed at [facility], [address], [place], [place] [address]  TSH     Status: None   Collection Time: 12/25/22 12:39 PM  Result Value Ref Range   TSH 2.215 0.350 - 4.500 uIU/mL    Comment: Performed by a 3rd Generation assay with a functional sensitivity of <=0.01 uIU/mL. Performed at [facility], [address] [place], [place] [address]     No current facility-administered medications for this encounter.   Current Outpatient Medications  Medication Sig Dispense Refill   doxycycline (VIBRAMYCIN) 100 MG capsule Take 1 capsule (100 mg total) by mouth 2 (two) times daily. 20 capsule 0   hydrOXYzine (ATARAX/VISTARIL) 10 MG tablet Take 1 tablet (10 mg total) by mouth 3 (three) times daily as needed. 90 tablet 3   paliperidone (INVEGA SUSTENNA) 234 MG/1.5ML SUSY injection Inject 234 mg into the muscle every 30 (thirty) days for 3 doses. 1.5 mL 3   predniSONE (STERAPRED UNI-PAK 21 TAB) 10 MG (21) TBPK tablet Take by mouth daily. Take 6 tabs by mouth daily  for 2 days, then 5 tabs for 2 days, then 4 tabs for 2 days, then 3 tabs for 2 days, 2 tabs for 2 days, then 1 tab by mouth daily for 2 days 42 tablet 0   traZODone (DESYREL) 50 MG tablet Take 1 tablet (50 mg total) by mouth at bedtime. 30 tablet 3    Musculoskeletal: Strength & Muscle Tone: within normal limits Gait & Station: normal Patient leans: N/A   Psychiatric Specialty Exam: Presentation  General Appearance:  Disheveled  Eye Contact: Poor  Speech: Garbled  Speech Volume: Normal  Handedness: Right   Mood and Affect  Mood: Irritable  Affect: Labile   Thought Process   Thought Processes: Disorganized  Descriptions of Associations:Circumstantial  Orientation:Partial (Oriented to person and place)  Thought Content:Delusions  History of Schizophrenia/Schizoaffective disorder:Yes  Duration of Psychotic Symptoms:Greater than six months  Hallucinations:Hallucinations: Auditory Description of Auditory Hallucinations: Patient refused to share content.  Ideas of Reference:None  Suicidal Thoughts:Suicidal Thoughts: No  Homicidal Thoughts:Homicidal Thoughts: No   Sensorium  Memory: Immediate Fair; Recent Fair; Remote Poor  Judgment: Poor  Insight: Poor   Executive Functions  Concentration: Fair  Attention Span: Fair  Recall: Fair  Fund of Knowledge: Poor  Language: Fair   Psychomotor Activity  Psychomotor Activity: Psychomotor Activity: Normal   Assets  Assets: Leisure Time    Sleep  Sleep: Sleep: Fair   Physical Exam: Physical Exam Vitals and nursing note reviewed.  Cardiovascular:     Rate and Rhythm: Normal rate.  Pulmonary:     Effort: Pulmonary effort is normal.  Skin:    General: Skin is dry.    Review of Systems  Psychiatric/Behavioral:  Positive for hallucinations.   All other systems reviewed and are negative.  Blood pressure 121/80, pulse 67, temperature 98 F (36.7 C), temperature source Oral, resp. rate 16, height 5\' 6"  (1.676 m), weight 90.7 kg, SpO2 98 %. Body mass index is 32.28 kg/m.  Medical Decision Making: Patient case reviewed and discussed with Dr Lucianne Muss.  Will meets criteria for [facility] and will recommended inpatient treatment.   Will start risperdone 2mg  PO Q HS  Disposition:  Recommend  inpatient treatment  Thomes Lolling, NP 12/25/2022 2:33 PM

## 2022-12-25 NOTE — Progress Notes (Signed)
LCSW Progress Note  [id number]   Dalton Rosario  12/25/2022  3:20 PM  Description:   Inpatient Psychiatric Referral  Patient was recommended inpatient per Phebe Colla, NP. There are no available beds at [facility], per [facility] Rona Ravens, RN. Patient was referred to the following out of network facilities:   [facility] Provider Address Phone Fax  [facility]  [address] [place] [address] [phone]  [facility]  [address] [place] [address] [phone]  [facility]  [address] [place] [address] [phone]  [facility]  [address] [place], [place] [address] [phone]  [facility] [address] [place]  [address] [place] [address] [phone]  [facility]  [address] [place], [place] [address] [phone]  [facility]  [address] [place] [address] [phone]  [facility]  [address] [place] [address] [phone]  [facility]  [address] [place] [address] [phone]  [facility]  [address] [place], [place] [address] [phone]  [facility]  [address] [place] [address] [phone]  [facility]  [address] [place] [address] [phone]  [facility]  [address], [place] [address] [phone]  [facility]  [address] [place] [address] [phone]  [facility]  [address] [place] [address] [phone]  [facility]  [address], [place]  [address] [phone]  [facility]  [address], [place] [address] [phone]  [facility]  [address] [place] [address] [phone]  [facility]  [address], [place] [address] [phone]  [facility]  [address], [place] [address] [phone]  [facility]  [address] [place] [address] [phone]  [facility]  [address] [place] [address] [phone]  [facility]  [address] [place] [address] [phone]  [facility]  [address] [place] [address] [phone]  [facility]  [address] [place] [address] [phone]    Situation ongoing, CSW to continue following and update chart as more information becomes available.      Cathie Beams, LCSW  12/25/2022 3:20 PM

## 2022-12-25 NOTE — ED Provider Notes (Signed)
..Laceration Repair  Date/Time: 12/25/2022 12:11 PM  Performed by: Cristopher Peru, PA-C Authorized by: Cristopher Peru, PA-C   Consent:    Consent obtained:  Verbal   Consent given by:  Patient   Risks, benefits, and alternatives were discussed: yes     Risks discussed:  Infection, poor cosmetic result and poor wound healing   Alternatives discussed:  No treatment Universal protocol:    Procedure explained and questions answered to patient or proxy's satisfaction: yes     Relevant documents present and verified: yes     Test results available: yes     Imaging studies available: yes     Required blood products, implants, devices, and special equipment available: yes     Site/side marked: yes     Immediately prior to procedure, a time out was called: yes     Patient identity confirmed:  Verbally with patient Anesthesia:    Anesthesia method:  Local infiltration   Local anesthetic:  Lidocaine 2% WITH epi Laceration details:    Location:  Leg   Leg location:  R lower leg   Length (cm):  1   Depth (mm):  3 Pre-procedure details:    Preparation:  Patient was prepped and draped in usual sterile fashion and imaging obtained to evaluate for foreign bodies Exploration:    Limited defect created (wound extended): no     Hemostasis achieved with:  Epinephrine   Imaging obtained: x-ray     Imaging outcome: foreign body not noted     Wound exploration: wound explored through full range of motion and entire depth of wound visualized     Wound extent: areolar tissue violated     Contaminated: yes   Treatment:    Area cleansed with:  Povidone-iodine   Amount of cleaning:  Standard   Irrigation solution:  Sterile saline   Irrigation volume:  500   Irrigation method:  Pressure wash   Debridement:  None   Undermining:  None Skin repair:    Repair method:  Sutures   Suture size:  3-0   Suture material:  Prolene   Suture technique:  Simple interrupted   Number of sutures:   3 Approximation:    Approximation:  Close Repair type:    Repair type:  Intermediate Post-procedure details:    Dressing:  Open (no dressing)   Procedure completion:  Tolerated well, no immediate complications .Marland KitchenLaceration Repair  Date/Time: 12/25/2022 12:12 PM  Performed by: Cristopher Peru, PA-C Authorized by: Cristopher Peru, PA-C   Consent:    Consent obtained:  Verbal   Consent given by:  Patient   Risks, benefits, and alternatives were discussed: yes     Risks discussed:  Infection, pain, retained foreign body, need for additional repair, poor cosmetic result and poor wound healing   Alternatives discussed:  No treatment Universal protocol:    Procedure explained and questions answered to patient or proxy's satisfaction: yes     Relevant documents present and verified: yes     Test results available: yes     Imaging studies available: yes     Required blood products, implants, devices, and special equipment available: yes     Site/side marked: yes     Immediately prior to procedure, a time out was called: yes     Patient identity confirmed:  Verbally with patient Anesthesia:    Anesthesia method:  Local infiltration   Local anesthetic:  Lidocaine 2% WITH epi Laceration details:  Location:  Shoulder/arm   Shoulder/arm location:  R lower arm   Length (cm):  6   Depth (mm):  5 Pre-procedure details:    Preparation:  Patient was prepped and draped in usual sterile fashion and imaging obtained to evaluate for foreign bodies Exploration:    Limited defect created (wound extended): no     Hemostasis achieved with:  Epinephrine   Imaging obtained: x-ray     Imaging outcome: foreign body not noted     Wound exploration: wound explored through full range of motion and entire depth of wound visualized     Wound extent: areolar tissue violated     Contaminated: yes   Treatment:    Area cleansed with:  Povidone-iodine   Amount of cleaning:  Standard   Irrigation solution:   Sterile saline   Irrigation volume:  500   Irrigation method:  Pressure wash   Debridement:  None   Undermining:  None Skin repair:    Repair method:  Sutures   Suture size:  3-0   Suture material:  Prolene   Suture technique:  Simple interrupted   Number of sutures:  8 Approximation:    Approximation:  Close Repair type:    Repair type:  Simple Post-procedure details:    Dressing:  Open (no dressing)   Procedure completion:  Tolerated well, no immediate complications     Cristopher Peru, PA-C 12/25/22 1213    Ernie Avena, MD 12/25/22 1214

## 2022-12-25 NOTE — ED Notes (Signed)
Pt belongings at bed side includes 1 yellow note,2 lighters, red shorts, shoes, socks and his bookbag. Pt also asked if his emer

## 2022-12-25 NOTE — ED Notes (Signed)
Patient transported to X-ray 

## 2022-12-25 NOTE — Progress Notes (Signed)
Pt is under review at [facility] [phone]. [facility] requested to speak with assigned nurse. CSW informed Cassandra of assigned nurse; Marylene Land, RN [phone]. Cassandra advised this CSW that Deon with [facility] would be reaching out. CSW/ Disposition team will assist and follow with placement.   Maryjean Ka, MSW, [facility] 12/25/2022 5:17 PM
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
## 2022-12-25 NOTE — ED Notes (Signed)
Pt belongings at bed side 2 lighters, a yellow note, shoes , red shorts and socks pt also asked his emergency contact could be called

## 2022-12-25 NOTE — ED Notes (Signed)
Belongings (2 bags) placed in locker #6. See previous note for inventory.

## 2022-12-26 MED ORDER — PALIPERIDONE PALMITATE ER 234 MG/1.5ML IM SUSY
234.0000 mg | PREFILLED_SYRINGE | Freq: Once | INTRAMUSCULAR | Status: AC
  Administered 2022-12-26: 234 mg via INTRAMUSCULAR
  Filled 2022-12-26 (×2): qty 1.5

## 2022-12-26 MED ORDER — LORAZEPAM 1 MG PO TABS
2.0000 mg | ORAL_TABLET | Freq: Three times a day (TID) | ORAL | Status: DC | PRN
  Administered 2022-12-27: 2 mg via ORAL
  Filled 2022-12-26: qty 2

## 2022-12-26 NOTE — ED Notes (Signed)
Called sheriff for pt picks 14:46, Recalled at 16:07 ETA 30 minutes

## 2022-12-26 NOTE — ED Notes (Addendum)
Pt is in room laying in bed calmly after decision was made to postpone transfer to [facility]. Sheriffs Dept left since [facility] would not accept pt today due to his aggression and agitation. Eligha Bridegroom, NP notified of change.

## 2022-12-26 NOTE — Progress Notes (Signed)
9:15 AM - CSW spoke with Cassandra [phone], with intake department at [facility] via phone call. Cassandra reports that pt is still under review at this time. CSW will continue to assist and follow with placement.  Cathie Beams, [place]  12/26/2022 9:19 AM

## 2022-12-26 NOTE — ED Provider Notes (Signed)
Pt accepted at [facility], Dr Betti Cruz.    Pt alert, content, no distress. Vitals normal.  Pt currently appears stable for transfer/transport.     Cathren Laine, MD 12/26/22 [phone]

## 2022-12-26 NOTE — ED Notes (Signed)
Safe transport called 

## 2022-12-26 NOTE — ED Notes (Signed)
IVC paperwork done in [place]. IVC'd:12/26/22; EXP: 01/02/23; Case #: [id number]

## 2022-12-26 NOTE — Consult Note (Cosign Needed Addendum)
  Received a call back from [facility] who confirmed he is on their ACT team. She reports he is compliant with medications usually. She suspects his drug use was the cause for this psychosis relapse.  Current medication list:  Invega 234mg  due on 12/22/22. Pt missed his appointment to receive this dose, ACTT is requesting he receive injection here.   Informed pt will be transferring to [facility] today. ACTT is requesting their contact information be given to [facility] to stay in touch with them.   Hinda Glatter will be given today 12/26/22 prior to transfer.   Number to reach ACTT is [phone]

## 2022-12-26 NOTE — ED Notes (Signed)
[phone]

## 2022-12-26 NOTE — ED Notes (Signed)
RN informed pt that [facility] was here to transport pt to [facility]. Pt became verbally aggressive towards staff and [employer]. RN attempted to redirect pt, but pt continued to scream and pace out of room. Sheriff arrived for transport and pt proceeded to punch wall and scream about not wanting to go to [facility]. Sheriffs dept notified [facility] that pt had become increasingly agitated and verbally aggressive. RN received a phone call from [facility] expressing concern about pt being transported to facility while being agitated and that they think pt should not come at this time. Pt continues to talk to himself and call staff/[facility] inappropriate names.

## 2022-12-26 NOTE — Progress Notes (Signed)
[facility] Psych ED Progress Note  12/26/2022 9:02 AM Dalton Rosario  MRN:  [id number]   Subjective:  Patient assessed this AM; he is disheveled and malodorous. During assessment, patient is laying on the bed and alternates between pulling the covers over his head and covering his head with a pillow.  He made very little eye contact and mumbled his responses to questions.  Patient endorses continued auditory hallucinations and reaffirms his desire to "kill the voices."    Patient states he didn't sleep well "because you people keep waking me up." After the interview ends, patient calls out into the hall asking for a cane.  When asked why he is requesting a cane, he says "I just want one."  He is told he may not have a cane at this time and he says "I know. I want one when I leave. You don't listen."  He refused to discuss further.  Patient interacted very little and was minimally engaged with assessment.  Principal Problem: <principal problem not specified> Diagnosis:  Active Problems:   Schizophrenia [facility])   ED Assessment Time Calculation: Start Time: 0820 Stop Time: 0859 Total Time in Minutes (Assessment Completion): 39   Past Psychiatric History: Previous inpatient stay, ACT team services and GCBUCOP   Columbia Scale:  Flowsheet Row ED from 12/25/2022 in [facility] Emergency Department at [facility] ED from 08/06/2022 in [facility] Emergency Department at [facility] ED from 03/18/2022 in [facility] Emergency Department at [facility]  C-SSRS RISK CATEGORY No Risk No Risk No Risk       Past Medical History:  Past Medical History:  Diagnosis Date   ADHD (attention deficit hyperactivity disorder)    Anxiety    Asthma    Depression    Schizo-affective schizophrenia, chronic condition (HCC) 2019    Past Surgical History:  Procedure Laterality Date   APPENDECTOMY     Pt was 27 yo   Family History: History reviewed. No pertinent family history.  Social  History:  Social History   Substance and Sexual Activity  Alcohol Use Yes     Social History   Substance and Sexual Activity  Drug Use Yes   Types: Cocaine, Marijuana    Social History   Socioeconomic History   Marital status: Single    Spouse name: Not on file   Number of children: Not on file   Years of education: Not on file   Highest education level: Not on file  Occupational History   Not on file  Tobacco Use   Smoking status: Every Day    Packs/day: .5    Types: Cigarettes   Smokeless tobacco: Never  Substance and Sexual Activity   Alcohol use: Yes   Drug use: Yes    Types: Cocaine, Marijuana   Sexual activity: Never  Other Topics Concern   Not on file  Social History Narrative   Not on file   Social Determinants of Health   Financial Resource Strain: Not on file  Food Insecurity: Not on file  Transportation Needs: Not on file  Physical Activity: Not on file  Stress: Not on file  Social Connections: Not on file    Sleep: Poor  Appetite:  Good  Current Medications: Current Facility-Administered Medications  Medication Dose Route Frequency Provider Last Rate Last Admin   risperiDONE (RISPERDAL) tablet 2 mg  2 mg Oral QHS Maxxwell Edgett A, NP   2 mg at 12/25/22 2142   ziprasidone (GEODON) injection  20 mg  20 mg Intramuscular Q12H PRN Eligha Bridegroom, NP       Current Outpatient Medications  Medication Sig Dispense Refill   doxycycline (VIBRAMYCIN) 100 MG capsule Take 1 capsule (100 mg total) by mouth 2 (two) times daily. 20 capsule 0   hydrOXYzine (ATARAX/VISTARIL) 10 MG tablet Take 1 tablet (10 mg total) by mouth 3 (three) times daily as needed. 90 tablet 3   paliperidone (INVEGA SUSTENNA) 234 MG/1.5ML SUSY injection Inject 234 mg into the muscle every 30 (thirty) days for 3 doses. 1.5 mL 3   predniSONE (STERAPRED UNI-PAK 21 TAB) 10 MG (21) TBPK tablet Take by mouth daily. Take 6 tabs by mouth daily  for 2 days, then 5 tabs for 2 days, then 4 tabs for 2  days, then 3 tabs for 2 days, 2 tabs for 2 days, then 1 tab by mouth daily for 2 days 42 tablet 0   traZODone (DESYREL) 50 MG tablet Take 1 tablet (50 mg total) by mouth at bedtime. 30 tablet 3    Lab Results:  Results for orders placed or performed during the hospital encounter of 12/25/22 (from the past 48 hour(s))  Comprehensive metabolic panel     Status: None   Collection Time: 12/25/22 12:39 PM  Result Value Ref Range   Sodium 138 135 - 145 mmol/L   Potassium 4.1 3.5 - 5.1 mmol/L   Chloride 102 98 - 111 mmol/L   CO2 25 22 - 32 mmol/L   Glucose, Bld 95 70 - 99 mg/dL    Comment: Glucose reference range applies only to samples taken after fasting for at least 8 hours.   BUN 8 6 - 20 mg/dL   Creatinine, Ser 1.61 0.61 - 1.24 mg/dL   Calcium 9.3 8.9 - 09.6 mg/dL   Total Protein 6.9 6.5 - 8.1 g/dL   Albumin 3.8 3.5 - 5.0 g/dL   AST 17 15 - 41 U/L   ALT 16 0 - 44 U/L   Alkaline Phosphatase 58 38 - 126 U/L   Total Bilirubin 0.4 0.3 - 1.2 mg/dL   GFR, Estimated >04 >54 mL/min    Comment: (NOTE) Calculated using the CKD-EPI Creatinine Equation (2021)    Anion gap 11 5 - 15    Comment: Performed at [facility], [address] [place], [place] [address]  Ethanol     Status: None   Collection Time: 12/25/22 12:39 PM  Result Value Ref Range   Alcohol, Ethyl (B) <10 <10 mg/dL    Comment: (NOTE) Lowest detectable limit for serum alcohol is 10 mg/dL.  For medical purposes only. Performed at [facility], [address], [place], [place] [address]   CBC with Diff     Status: None   Collection Time: 12/25/22 12:39 PM  Result Value Ref Range   WBC 9.9 4.0 - 10.5 K/uL   RBC 4.68 4.22 - 5.81 MIL/uL   Hemoglobin 14.2 13.0 - 17.0 g/dL   HCT 29.5 62.1 - 30.8 %   MCV 90.6 80.0 - 100.0 fL   MCH 30.3 26.0 - 34.0 pg   MCHC 33.5 30.0 - 36.0 g/dL   RDW 65.7 84.6 - 96.2 %   Platelets 205 150 - 400 K/uL   nRBC 0.0 0.0 - 0.2 %   Neutrophils Relative % 69 %   Neutro Abs 6.8 1.7  - 7.7 K/uL   Lymphocytes Relative 22 %   Lymphs Abs 2.2 0.7 - 4.0 K/uL  Monocytes Relative 8 %   Monocytes Absolute 0.8 0.1 - 1.0 K/uL   Eosinophils Relative 1 %   Eosinophils Absolute 0.1 0.0 - 0.5 K/uL   Basophils Relative 0 %   Basophils Absolute 0.0 0.0 - 0.1 K/uL   Immature Granulocytes 0 %   Abs Immature Granulocytes 0.03 0.00 - 0.07 K/uL    Comment: Performed at [facility], [address], [place], [place] [address]  TSH     Status: None   Collection Time: 12/25/22 12:39 PM  Result Value Ref Range   TSH 2.215 0.350 - 4.500 uIU/mL    Comment: Performed by a 3rd Generation assay with a functional sensitivity of <=0.01 uIU/mL. Performed at [facility], [address] [place], [place] [address]   SARS Coronavirus 2 by RT PCR (hospital order, performed in [facility] hospital lab) *cepheid single result test* Anterior Nasal Swab     Status: None   Collection Time: 12/25/22  5:19 PM   Specimen: Anterior Nasal Swab  Result Value Ref Range   SARS Coronavirus 2 by RT PCR NEGATIVE NEGATIVE    Comment: Performed at [facility], [address], [place], [place] [address]  Urine rapid drug screen (hosp performed)     Status: Abnormal   Collection Time: 12/25/22 10:16 PM  Result Value Ref Range   Opiates NONE DETECTED NONE DETECTED   Cocaine POSITIVE (A) NONE DETECTED   Benzodiazepines NONE DETECTED NONE DETECTED   Amphetamines POSITIVE (A) NONE DETECTED   Tetrahydrocannabinol POSITIVE (A) NONE DETECTED   Barbiturates NONE DETECTED NONE DETECTED    Comment: (NOTE) DRUG SCREEN FOR MEDICAL PURPOSES ONLY.  IF CONFIRMATION IS NEEDED FOR ANY PURPOSE, NOTIFY LAB WITHIN 5 DAYS.  LOWEST DETECTABLE LIMITS FOR URINE DRUG SCREEN Drug Class                     Cutoff (ng/mL) Amphetamine and metabolites    1000 Barbiturate and metabolites    200 Benzodiazepine                 200 Opiates and metabolites        300 Cocaine and metabolites        300 THC                             50 Performed at [facility], [address], [place], [place] [address]     Blood Alcohol level:  Lab Results  Component Value Date   [facility] <10 12/25/2022   ETH <10 02/17/2021    Physical Findings:  CIWA:    COWS:     Musculoskeletal: Strength & Muscle Tone: within normal limits Gait & Station: normal Patient leans: N/A  Psychiatric Specialty Exam:  Presentation  General Appearance:  Disheveled; Other (comment) (Patient is malodorous)  Eye Contact: Poor  Speech: Garbled  Speech Volume: Normal  Handedness: Right   Mood and Affect  Mood: Irritable  Affect: Labile   Thought Process  Thought Processes: Disorganized  Descriptions of Associations:Tangential  Orientation:Partial (Oriented to person and place)  Thought Content:Tangential  History of Schizophrenia/Schizoaffective disorder:Yes  Duration of Psychotic Symptoms:Greater than six months  Hallucinations:Hallucinations: Auditory Description of Auditory Hallucinations: Patient is hearing voices, but will not share what voices are saying  Ideas of Reference:None  Suicidal Thoughts:Suicidal Thoughts: No  Homicidal Thoughts:Homicidal Thoughts: No   Sensorium  Memory: Immediate Fair; Remote  Fair; Recent Fair  Judgment: Impaired  Insight: Poor   Chartered certified accountant: Poor  Attention Span: Poor  Recall: Poor  Fund of Knowledge: Poor  Language: Poor   Psychomotor Activity  Psychomotor Activity: Psychomotor Activity: Normal   Assets  Assets: Leisure Time; Desire for Improvement   Sleep  Sleep: Sleep: Fair    Physical Exam: Physical Exam Vitals and nursing note reviewed.  Skin:    General: Skin is dry.  Neurological:     Comments: Oriented X 2; oriented to person and place    Review of Systems  Psychiatric/Behavioral:  Positive for hallucinations.   All other systems reviewed and are negative.  Blood pressure  110/66, pulse 66, temperature 98.4 F (36.9 C), temperature source Oral, resp. rate 16, height 5\' 6"  (1.676 m), weight 90.7 kg, SpO2 99 %. Body mass index is 32.28 kg/m.   Medical Decision Making: Patient continues to meet criteria for IVC and inpatient psychiatric treatment.  [facility] has no availability at this time. CSW will continue to fax out patient.   Thomes Lolling, NP 12/26/2022, 9:02 AM

## 2022-12-26 NOTE — ED Notes (Signed)
Pts meal tray delivered to room. Pt sitting in bed and eating at this time.
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
## 2022-12-26 NOTE — ED Notes (Signed)
Pt sleeping comfortably at this time. Vital signs deferred to promote pt comfort and rest

## 2022-12-26 NOTE — Discharge Instructions (Signed)
It was our pleasure to provide your ER care today - we hope that you feel better.  Transfer to [facility].  Keep wounds very clean. Have sutures removed in 9-10 days.

## 2022-12-26 NOTE — Progress Notes (Signed)
Pt was accepted to [facility] TODAY 12/26/2022. Bed assignment: Cheron Every  Pt meets inpatient criteria per Phebe Colla, NP  Attending Physician will be Ellamae Sia, MD  Report can be called to: [phone]  Bed is ready now  Care Team Notified: Eligha Bridegroom, NP, Phebe Colla, NP, and Shann Medal, RN  [place], [place]  12/26/2022 10:18 AM

## 2022-12-26 NOTE — Progress Notes (Signed)
This CSW called and spoke with [facility], Intake who informed that pt can transfer to [facility] TOMORROW 12/27/2022 after 8:00am   Pt was accepted to [facility] TOMORROW 12/27/2022; Bed assignment: Cheron Every   Pt meets inpatient criteria per Phebe Colla, NP   Attending Physician will be Ellamae Sia, MD  BED IS READY AT: 8:00am  Care Team notified: Phebe Colla, NP, Madelin Rear, Reginold Agent, Disposition CSW [place], LCSW    Maryjean Ka, MSW, [facility] 12/26/2022 10:25 PM

## 2022-12-27 NOTE — ED Notes (Signed)
Pt agitated with staff, cursing at staff and being verbally aggressive towards staff.
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
## 2022-12-27 NOTE — ED Notes (Signed)
Sheriff called for transport to [facility]. They should be here around 8am.

## 2022-12-27 NOTE — ED Notes (Signed)
Pt complained about transfer to [facility], went to the bathroom and allowed [facility] to transport. All belongings and paperwork given to [facility].

## 2023-06-01 ENCOUNTER — Emergency Department (HOSPITAL_COMMUNITY): Payer: MEDICAID

## 2023-06-01 ENCOUNTER — Emergency Department (HOSPITAL_COMMUNITY)
Admission: EM | Admit: 2023-06-01 | Discharge: 2023-06-01 | Disposition: A | Discharge status: Home / Self Care | Payer: MEDICAID

## 2023-06-01 ENCOUNTER — Encounter (HOSPITAL_COMMUNITY): Payer: Self-pay

## 2023-06-01 ENCOUNTER — Other Ambulatory Visit: Payer: Self-pay

## 2023-06-01 DIAGNOSIS — Z1152 Encounter for screening for COVID-19: Secondary | ICD-10-CM | POA: Insufficient documentation

## 2023-06-01 DIAGNOSIS — S0992XA Unspecified injury of nose, initial encounter: Secondary | ICD-10-CM | POA: Insufficient documentation

## 2023-06-01 DIAGNOSIS — J4521 Mild intermittent asthma with (acute) exacerbation: Secondary | ICD-10-CM | POA: Diagnosis not present

## 2023-06-01 LAB — RESP PANEL BY RT-PCR (RSV, FLU A&B, COVID)  RVPGX2
Influenza A by PCR: NEGATIVE
Influenza B by PCR: NEGATIVE
Resp Syncytial Virus by PCR: NEGATIVE
SARS Coronavirus 2 by RT PCR: NEGATIVE

## 2023-06-01 MED ORDER — PREDNISONE 20 MG PO TABS: 40.0000 mg | ORAL_TABLET | Freq: Every day | ORAL | 0 refills | Status: AC

## 2023-06-01 MED ORDER — ACETAMINOPHEN 325 MG PO TABS
650.0000 mg | ORAL_TABLET | Freq: Once | ORAL | Status: AC
  Administered 2023-06-01: 650 mg via ORAL
  Filled 2023-06-01: qty 2

## 2023-06-01 MED ORDER — AEROCHAMBER Z-STAT PLUS/MEDIUM MISC
1.0000 | Freq: Once | Status: DC
  Filled 2023-06-01: qty 1

## 2023-06-01 MED ORDER — ALBUTEROL SULFATE HFA 108 (90 BASE) MCG/ACT IN AERS
4.0000 | INHALATION_SPRAY | Freq: Once | RESPIRATORY_TRACT | Status: AC
  Administered 2023-06-01: 4 via RESPIRATORY_TRACT
  Filled 2023-06-01: qty 6.7

## 2023-06-01 MED ORDER — PREDNISONE 20 MG PO TABS
60.0000 mg | ORAL_TABLET | Freq: Once | ORAL | Status: AC
  Administered 2023-06-01: 60 mg via ORAL
  Filled 2023-06-01: qty 3
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
## 2023-06-01 NOTE — ED Provider Notes (Signed)
Handoff from C. Aberman PA. C/o URI, SOB sx, pending CXR. Given prednisone and albuterol. Meds already sent.   Physical Exam  BP (!) 118/96   Pulse 95   Temp 98.2 F (36.8 C) (Oral)   Resp 19   Ht 5\' 6"  (1.676 m)   Wt 90.7 kg   SpO2 96%   BMI 32.28 kg/m   Physical Exam Vitals and nursing note reviewed.  Constitutional:      General: He is not in acute distress.    Appearance: He is well-developed.  HENT:     Head: Normocephalic and atraumatic.  Eyes:     Conjunctiva/sclera: Conjunctivae normal.  Cardiovascular:     Rate and Rhythm: Normal rate and regular rhythm.     Heart sounds: No murmur heard. Pulmonary:     Effort: Pulmonary effort is normal. No respiratory distress.     Breath sounds: Normal breath sounds.  Abdominal:     Palpations: Abdomen is soft.     Tenderness: There is no abdominal tenderness.  Musculoskeletal:        General: No swelling.     Cervical back: Neck supple.  Skin:    General: Skin is warm and dry.     Capillary Refill: Capillary refill takes less than 2 seconds.  Neurological:     Mental Status: He is alert.  Psychiatric:        Mood and Affect: Mood normal.     Procedures  Procedures  ED Course / MDM   Clinical Course as of 06/01/23 1504  Fri Jun 01, 2023  1332 Pulse Rate: 90 [CA]    Clinical Course User Index [CA] [name], PA-C   Medical Decision Making Patient is a 27 year old male, here for upper respiratory infection symptoms.  Chest xray clear. Will have him f/u with PCP. Discharged w/prednisone and albuterol for possible asthma exacerbation.   Amount and/or Complexity of Data Reviewed Radiology: ordered.    Details: CXR clear  Risk OTC drugs. Prescription drug management.         [name], [place] 06/01/23 2218    Melene Plan, DO 06/04/23 [phone]
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
## 2023-06-01 NOTE — Discharge Instructions (Addendum)
It was a pleasure taking care of you today. As discussed, your CT scan did  not show any broken bones in your nose. I am sending you home with steroids and an inhaler for asthma. Your COVID and flu test were negative. Return to the ER for new or worsening symptoms.
